# Patient Record
Sex: Female | Born: 1950 | Race: White | Hispanic: No | Marital: Married | Smoking: Never smoker
Health system: Southern US, Community
[De-identification: ages and names within clinical notes are randomized; demographics above are authoritative.]

## PROBLEM LIST (undated history)

## (undated) DIAGNOSIS — K5790 Diverticulosis of intestine, part unspecified, without perforation or abscess without bleeding: Secondary | ICD-10-CM

## (undated) DIAGNOSIS — F32A Depression, unspecified: Secondary | ICD-10-CM

## (undated) DIAGNOSIS — M1990 Unspecified osteoarthritis, unspecified site: Secondary | ICD-10-CM

## (undated) DIAGNOSIS — Z8719 Personal history of other diseases of the digestive system: Secondary | ICD-10-CM

## (undated) DIAGNOSIS — Z8639 Personal history of other endocrine, nutritional and metabolic disease: Secondary | ICD-10-CM

## (undated) DIAGNOSIS — Z86010 Personal history of colonic polyps: Secondary | ICD-10-CM

## (undated) DIAGNOSIS — N390 Urinary tract infection, site not specified: Secondary | ICD-10-CM

## (undated) DIAGNOSIS — K219 Gastro-esophageal reflux disease without esophagitis: Secondary | ICD-10-CM

## (undated) DIAGNOSIS — M810 Age-related osteoporosis without current pathological fracture: Secondary | ICD-10-CM

## (undated) DIAGNOSIS — E7800 Pure hypercholesterolemia, unspecified: Secondary | ICD-10-CM

## (undated) DIAGNOSIS — Z860101 Personal history of adenomatous and serrated colon polyps: Secondary | ICD-10-CM

## (undated) DIAGNOSIS — K449 Diaphragmatic hernia without obstruction or gangrene: Secondary | ICD-10-CM

## (undated) DIAGNOSIS — F329 Major depressive disorder, single episode, unspecified: Secondary | ICD-10-CM

## (undated) HISTORY — DX: Diaphragmatic hernia without obstruction or gangrene: K44.9

## (undated) HISTORY — DX: Age-related osteoporosis without current pathological fracture: M81.0

## (undated) HISTORY — DX: Unspecified osteoarthritis, unspecified site: M19.90

## (undated) HISTORY — DX: Diverticulosis of intestine, part unspecified, without perforation or abscess without bleeding: K57.90

## (undated) HISTORY — DX: Personal history of other endocrine, nutritional and metabolic disease: Z86.39

## (undated) HISTORY — DX: Personal history of adenomatous and serrated colon polyps: Z86.0101

## (undated) HISTORY — DX: Personal history of colonic polyps: Z86.010

## (undated) HISTORY — PX: COLONOSCOPY W/ POLYPECTOMY: SHX1380

---
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
## 1989-08-04 HISTORY — PX: VAGINAL HYSTERECTOMY: SHX2639

## 1999-06-03 ENCOUNTER — Other Ambulatory Visit: Admission: RE | Admit: 1999-06-03 | Discharge: 1999-06-03 | Payer: Self-pay | Admitting: Obstetrics and Gynecology

## 1999-11-27 ENCOUNTER — Encounter (INDEPENDENT_AMBULATORY_CARE_PROVIDER_SITE_OTHER): Payer: Self-pay | Admitting: Specialist

## 1999-11-27 ENCOUNTER — Ambulatory Visit (HOSPITAL_COMMUNITY): Admission: RE | Admit: 1999-11-27 | Discharge: 1999-11-27 | Payer: Self-pay | Admitting: Gastroenterology

## 2000-09-28 ENCOUNTER — Encounter: Payer: Self-pay | Admitting: Obstetrics and Gynecology

## 2000-09-28 ENCOUNTER — Encounter: Admission: RE | Admit: 2000-09-28 | Discharge: 2000-09-28 | Payer: Self-pay | Admitting: Obstetrics and Gynecology

## 2001-06-15 ENCOUNTER — Other Ambulatory Visit: Admission: RE | Admit: 2001-06-15 | Discharge: 2001-06-15 | Payer: Self-pay | Admitting: Obstetrics and Gynecology

## 2002-06-09 ENCOUNTER — Other Ambulatory Visit: Admission: RE | Admit: 2002-06-09 | Discharge: 2002-06-09 | Payer: Self-pay | Admitting: Obstetrics and Gynecology

## 2002-08-18 ENCOUNTER — Encounter: Admission: RE | Admit: 2002-08-18 | Discharge: 2002-11-16 | Payer: Self-pay | Admitting: Obstetrics and Gynecology

## 2003-06-22 ENCOUNTER — Other Ambulatory Visit: Admission: RE | Admit: 2003-06-22 | Discharge: 2003-06-22 | Payer: Self-pay | Admitting: Obstetrics and Gynecology

## 2003-09-22 ENCOUNTER — Ambulatory Visit (HOSPITAL_COMMUNITY): Admission: RE | Admit: 2003-09-22 | Discharge: 2003-09-22 | Payer: Self-pay | Admitting: Gastroenterology

## 2005-03-28 ENCOUNTER — Ambulatory Visit: Payer: Self-pay | Admitting: Internal Medicine

## 2006-07-08 ENCOUNTER — Ambulatory Visit: Payer: Self-pay | Admitting: Internal Medicine

## 2006-08-04 HISTORY — PX: TOTAL HIP ARTHROPLASTY: SHX124

## 2007-03-18 ENCOUNTER — Encounter: Admission: RE | Admit: 2007-03-18 | Discharge: 2007-03-18 | Payer: Self-pay | Admitting: Orthopedic Surgery

## 2007-05-11 ENCOUNTER — Encounter: Admission: RE | Admit: 2007-05-11 | Discharge: 2007-05-11 | Payer: Self-pay | Admitting: Orthopedic Surgery

## 2007-06-11 ENCOUNTER — Encounter: Payer: Self-pay | Admitting: Internal Medicine

## 2007-07-11 ENCOUNTER — Encounter: Payer: Self-pay | Admitting: Internal Medicine

## 2007-07-14 ENCOUNTER — Encounter: Payer: Self-pay | Admitting: Internal Medicine

## 2007-08-11 ENCOUNTER — Encounter: Payer: Self-pay | Admitting: Internal Medicine

## 2007-08-26 ENCOUNTER — Encounter: Payer: Self-pay | Admitting: Internal Medicine

## 2007-08-30 ENCOUNTER — Inpatient Hospital Stay (HOSPITAL_COMMUNITY): Admission: RE | Admit: 2007-08-30 | Discharge: 2007-09-01 | Payer: Self-pay | Admitting: Orthopedic Surgery

## 2007-12-03 ENCOUNTER — Ambulatory Visit: Payer: Self-pay | Admitting: Internal Medicine

## 2007-12-03 DIAGNOSIS — R609 Edema, unspecified: Secondary | ICD-10-CM | POA: Insufficient documentation

## 2007-12-03 DIAGNOSIS — M2550 Pain in unspecified joint: Secondary | ICD-10-CM | POA: Insufficient documentation

## 2007-12-03 DIAGNOSIS — IMO0001 Reserved for inherently not codable concepts without codable children: Secondary | ICD-10-CM

## 2007-12-03 LAB — CONVERTED CEMR LAB
Basophils Absolute: 0 10*3/uL (ref 0.0–0.1)
Basophils Relative: 0.4 % (ref 0.0–1.0)
Calcium: 9.4 mg/dL (ref 8.4–10.5)
Eosinophils Absolute: 0.2 10*3/uL (ref 0.0–0.7)
Eosinophils Relative: 4.3 % (ref 0.0–5.0)
Free T4: 0.8 ng/dL (ref 0.6–1.6)
HCT: 38 % (ref 36.0–46.0)
Hemoglobin: 12.5 g/dL (ref 12.0–15.0)
Lymphocytes Relative: 39 % (ref 12.0–46.0)
MCHC: 33 g/dL (ref 30.0–36.0)
MCV: 80.6 fL (ref 78.0–100.0)
Magnesium: 2.2 mg/dL (ref 1.5–2.5)
Monocytes Absolute: 0.4 10*3/uL (ref 0.1–1.0)
Monocytes Relative: 6.5 % (ref 3.0–12.0)
Neutro Abs: 2.8 10*3/uL (ref 1.4–7.7)
Neutrophils Relative %: 49.8 % (ref 43.0–77.0)
Platelets: 200 10*3/uL (ref 150–400)
Potassium: 3.5 meq/L (ref 3.5–5.1)
RBC: 4.72 M/uL (ref 3.87–5.11)
RDW: 13.9 % (ref 11.5–14.6)
Rheumatoid fact SerPl-aCnc: <20 intl units/mL — ABNORMAL LOW (ref 0.0–20.0)
Sed Rate: 13 mm/hr (ref 0–22)
TSH: 1.14 microintl units/mL (ref 0.35–5.50)
Total CK: 42 units/L (ref 7–177)
Uric Acid, Serum: 5.9 mg/dL (ref 2.4–7.0)
Vit D, 1,25-Dihydroxy: 32 (ref 30–89)
WBC: 5.6 10*3/uL (ref 4.5–10.5)

## 2007-12-07 ENCOUNTER — Telehealth (INDEPENDENT_AMBULATORY_CARE_PROVIDER_SITE_OTHER): Payer: Self-pay | Admitting: *Deleted

## 2007-12-07 ENCOUNTER — Encounter: Payer: Self-pay | Admitting: Internal Medicine

## 2007-12-09 ENCOUNTER — Ambulatory Visit: Payer: Self-pay | Admitting: Internal Medicine

## 2007-12-09 DIAGNOSIS — E559 Vitamin D deficiency, unspecified: Secondary | ICD-10-CM | POA: Insufficient documentation

## 2007-12-09 DIAGNOSIS — F329 Major depressive disorder, single episode, unspecified: Secondary | ICD-10-CM

## 2007-12-10 ENCOUNTER — Encounter: Payer: Self-pay | Admitting: Internal Medicine

## 2007-12-14 ENCOUNTER — Encounter (INDEPENDENT_AMBULATORY_CARE_PROVIDER_SITE_OTHER): Payer: Self-pay | Admitting: *Deleted

## 2008-05-30 ENCOUNTER — Telehealth (INDEPENDENT_AMBULATORY_CARE_PROVIDER_SITE_OTHER): Payer: Self-pay | Admitting: *Deleted

## 2008-06-05 ENCOUNTER — Telehealth (INDEPENDENT_AMBULATORY_CARE_PROVIDER_SITE_OTHER): Payer: Self-pay | Admitting: *Deleted

## 2008-09-29 ENCOUNTER — Telehealth (INDEPENDENT_AMBULATORY_CARE_PROVIDER_SITE_OTHER): Payer: Self-pay | Admitting: *Deleted

## 2008-10-06 ENCOUNTER — Telehealth (INDEPENDENT_AMBULATORY_CARE_PROVIDER_SITE_OTHER): Payer: Self-pay | Admitting: *Deleted

## 2009-01-03 ENCOUNTER — Telehealth (INDEPENDENT_AMBULATORY_CARE_PROVIDER_SITE_OTHER): Payer: Self-pay | Admitting: *Deleted

## 2009-02-27 ENCOUNTER — Telehealth (INDEPENDENT_AMBULATORY_CARE_PROVIDER_SITE_OTHER): Payer: Self-pay | Admitting: *Deleted

## 2009-04-04 ENCOUNTER — Telehealth (INDEPENDENT_AMBULATORY_CARE_PROVIDER_SITE_OTHER): Payer: Self-pay | Admitting: *Deleted

## 2009-06-26 ENCOUNTER — Ambulatory Visit: Payer: Self-pay | Admitting: Internal Medicine

## 2010-12-17 NOTE — Op Note (Signed)
NAME:  Katelyn Cuevas, SKEEN          ACCOUNT NO.:  [id number]   MEDICAL RECORD NO.:  [id number]          PATIENT TYPE:  INP   LOCATION:  2550                         FACILITY:  [facility]   PHYSICIAN:  Elana Alm. Thurston Hole, M.D. DATE OF BIRTH:  07/13/1951   DATE OF PROCEDURE:  08/30/2007  DATE OF DISCHARGE:                               OPERATIVE REPORT   PREOPERATIVE DIAGNOSIS:  Left hip degenerative joint disease.   POSTOPERATIVE DIAGNOSIS:  Left hip degenerative joint disease.   PROCEDURE:  Left total hip replacement using DePuy S-ROM Press-Fit total  hip system with acetabulum, 52 mm Press-Fit ASR acetabular cup with  femoral component 18 x 13 femoral stem with 36 standard femoral neck  with +3 x 46 mm ASR head with 18D large ZTT sleeve.   SURGEON:  Salvatore Marvel, MD.   ASSISTANT:  Julien Girt, PA.   ANESTHESIA:  General.   OPERATIVE TIME:  1 hour and 15 minutes.   ESTIMATED BLOOD LOSS:  150 mL.   COMPLICATIONS:  None.   DESCRIPTION OF PROCEDURE:  [name] was brought in the operating  room on August 30, 2007, placed on the operative table in the supine  position.  After an adequate level of general anesthesia was obtained  she had a Foley catheter placed under sterile conditions.  She received  Ancef 1 grams IV preoperatively for prophylaxis.  Her left hip was  examined.  She had flexion to 90, extension to 0, internal and external  rotation of 20 degrees with approximately 1/2-inch of shortening of the  left leg compared to the right.  She was then turned in the left lateral  up decubitus position and secured on the bed with a Mark frame.  Her  left hip and leg was prepped using sterile DuraPrep and draped using  sterile technique.  Originally, through a 10 and 12 cm posterolateral  greater trochanteric incision initial exposure was made.  The  subcutaneous tissues were incised in line with skin incision.  The  iliotibial band and gluteus maximus fascia was incised  longitudinally,  revealing the underlying sciatic nerve which was carefully protected.  The short external rotators of the hip and hip capsule were released off  the femoral neck insertion and tagged.  The hip was then posteriorly  dislocated.  The femoral head was found to have significant grade 3 and  4 DJD.  A femoral neck cut was then made 1.5 to 2 cm above the lesser  trochanter and the femoral head was removed.  Carefully we placed  retractors, were placed around the acetabulum.  Degenerative acetabular  labrum was removed.  The acetabulum showed grade 3 and 4 DJD as well.  Sequential acetabular reaming was then carried out to a 51 mm size in  the appropriate amount of anteversion, abduction and inclination and  then a 52 mm trial cup was placed with an excellent fit.  It was then  removed and the actual 52 mm ASR cup in the appropriate amount of  anteversion, abduction and inclination was hammered into position with  an excellent fit.  After this was done, then these  retractors were  carefully removed and then the proximal femur was exposed.  Sequential  femoral reaming was carried out to a 13.5 size and proximal reaming to  an 18 and then the calcar reaming was carried out to an 18D large.  The  18D large sleeve trial was placed and then the femoral stem trial was  placed with a +3 x 3 6 mm femoral head trial.  The hip was then reduced,  taken through a range of motion, found to be a full range of motion and  stable up to 80 degrees of internal rotation in both neutral and 30  degrees of abduction and also stable on abduction, external rotation.  After this was done it was found to be an excellent fit and the femoral  trials were removed.  The femoral canal thoroughly irrigated and then  the 18D large sleeve prosthesis was hammered into position with an  excellent fit and then the 18 x 13 femoral stem with a 36 standard neck  was hammered in position and the appropriate amount of  anteversion with  an excellent tight fit, and then the +3 x 46 mm ASR head was hammered on  the femoral neck with an excellent Morse taper fit.  The hip was then  reduced, taken through a full range of motion, found to be stable, leg  lengths equal.  At this point it was felt that all components were of  excellent size, fit and stability.  The wound was further irrigated and  then the short external rotators of the hip and hip capsule were  reattached to their femoral neck insertion through two drill holes in  greater trochanter.  The iliotibial band and gluteus maximus fascia was  closed with a #1 Ethibond suture.  The subcutaneous tissue was closed  with #0 and #2-0 Vicryl, subcuticular layer closed with #4-0 Monocryl,  sterile dressings were applied.  The patient then turned supine, checked  for leg lengths equal, rotation equal, pulses 2+ and symmetric.  Needle  and sponge counts correct x3 at the end of the case.  She was then  awakened, extubated and taken to the recovery room in stable condition.      Robert A. Thurston Hole, M.D.  Electronically Signed     RAW/MEDQ  D:  08/30/2007  T:  08/30/2007  Job:  [id number]

## 2010-12-20 NOTE — Assessment & Plan Note (Signed)
[facility]                                 ON-CALL NOTE   NAME:WILLIAMSNeriah, Brott                   MRN:          [id number]  DATE:10/22/2006                            DOB:          [birth year]    The patient calling because her grandchild has been diagnosed with the  flu.  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  She had her flu shot.  Recommend that she use the usual precautions.  We  then discussed Tamiflu.  I explained to her the potential side effects  of Tamiflu and the fact that it only shortens the duration of the  illness by 12 hours and really was not worthwhile.     Jeffrey A. Tawanna Cooler, MD  Electronically Signed    JAT/MedQ  DD: 10/22/2006  DT: 10/23/2006  Job #: [id number]

## 2010-12-20 NOTE — H&P (Signed)
NAME:  Katelyn Cuevas, Katelyn Cuevas                    ACCOUNT NO.:  [id number]   MEDICAL RECORD NO.:  [id number]                   PATIENT TYPE:  AMB   LOCATION:  ENDO                                 FACILITY:  [facility]   PHYSICIAN:  Bernette Redbird, M.D.                DATE OF BIRTH:  [birth year]   DATE OF ADMISSION:  09/22/2003  DATE OF DISCHARGE:  09/22/2003                                HISTORY & PHYSICAL   REASON FOR ADMISSION:  This 60 year old female is coming into the endoscopy  unit for surveillance colonoscopy, having had small colonic adenomas removed  several years ago.   PAST MEDICAL HISTORY:  No known allergies.   OUTPATIENT MEDICATIONS:  Effexor and estrogen supplement.   OPERATIONS:  Hysterectomy.   CHRONIC MEDICAL ILLNESSES:  No cardiopulmonary disease, diabetes, or  hypertension.   PHYSICAL EXAMINATION:  CHEST AND HEART:  Unremarkable.  GENERAL:  Alert and oriented.   IMPRESSION:  Suitable for IV conscious sedation.   PLAN:  Proceed to colonoscopy.                                                Bernette Redbird, M.D.    RB/MEDQ  D:  10/18/2003  T:  10/19/2003  Job:  [id number]

## 2010-12-20 NOTE — Op Note (Signed)
NAME:  Katelyn Cuevas, Katelyn Cuevas                    ACCOUNT NO.:  [id number]   MEDICAL RECORD NO.:  [id number]                   PATIENT TYPE:  AMB   LOCATION:  ENDO                                 FACILITY:  [facility]   PHYSICIAN:  Bernette Redbird, M.D.                DATE OF BIRTH:  [birth year]   DATE OF PROCEDURE:  09/22/2003  DATE OF DISCHARGE:                                 OPERATIVE REPORT   PROCEDURE:  Colonoscopy.   INDICATIONS:  This is a 60 year old female, now 4 years status post removal  of a couple of small adenomatous polyps.  There is a family history of colon  polyps as well.   FINDINGS:  Normal exam to the cecum.  No evidence of recurrent polyps.   PROCEDURE:  The nature, purpose, and risks of the procedure were familiar to  the patient from prior examination, and she provided written consent.  Sedation was fentanyl 75 mcg and Versed 8 mg IV, without arrhythmias or  desaturations.   The Olympus adjustable-tension pediatric videocolonoscope was advanced to  the using some external abdominal compression to control looping, and  pullback was then performed.  The quality of the prep was excellent, and it  was found that all areas were well seen.   This was a normal exam.  Specifically, there was no evidence of recurrent  polyps and no evidence of cancer, colitis, vascular malformations, or  diverticulosis.  Retroflexion in the rectum and reinspection of the rectum  and lower sigmoid was unremarkable.  No biopsies were obtained.  The patient  tolerated the procedure well, and there were no apparent complications.   IMPRESSION:  1. History of prior colonic adenomas, with currently negative exam (V12.72).  2. Family history of colon polyps (V19.8).   PLAN:  Follow-up colonoscopy in 5 years.                                               Bernette Redbird, M.D.    RB/MEDQ  D:  09/22/2003  T:  09/23/2003  Job:  [id number]   cc:   Juluis Mire, M.D.  [address] [place]  [place] [address]  Fax: [phone]   Titus Dubin. Alwyn Ren, M.D. [facility]

## 2010-12-20 NOTE — Procedures (Signed)
[facility]  Patient:    Katelyn Cuevas, Katelyn Cuevas                 MRN: [id number] Proc. Date: 11/27/99 Adm. Date:  [id number] Attending:  Rich Brave CC:         Ronnald Nian, M.D.                           Procedure Report  PROCEDURE:  Colonoscopy with polypectomy and hot biopsy.  INDICATION:  A 60 year old female with family history of colon polyps, for screening.  FINDINGS:  Several small to medium sized polyps removed.  INFORMED CONSENT:  The nature, purpose, and risks of the procedure had been discussed with the patient who provided written consent.  PREMEDICATION:  Fentanyl 60 mcg and Versed 6 mg IV without arrhythmias or desaturations.  DESCRIPTION OF PROCEDURE:  The Olympus pediatric video adjustable tension colonoscope was easily advanced to the cecum as identified by visualization of he appendiceal orifice.  Pullback was then performed.  The quality of prep was excellent.  It was felt that all areas were well seen.  In the cecum was a small 2 mm sessile hyperplastic appearing polyp removed by single cold biopsy as well as a somewhat larger slightly verrucous pale polypoid measuring about 2 by 3 mm, removed by several hot biopsies with essentially complete ablation of the lesion.  At 35 cm, there was a semipedunculated 7 mm polyp removed in two pieces by snare technique with complete hemostasis and no evidence of excessive cautery and nearby a 3 mm sessile polyp removed by hot biopsy.  No large polyps, cancer, vascular malformations, or diverticular disease were observed.  Retroflexion in the rectum was unremarkable.  The patient tolerated the procedure well and there were no apparent complications.  IMPRESSION:  Colon polyps removed as described above.  Otherwise normal exam.  PLAN:  Follow-up colonoscopy in three to five years depending on the histologic  findings on the polyps. DD:  11/27/99 TD:   11/27/99 Job: [id number]

## 2011-04-24 LAB — BASIC METABOLIC PANEL
BUN: 7
CO2: 31
Calcium: 8.2 — ABNORMAL LOW
Calcium: 8.5
Creatinine, Ser: 0.63
GFR calc Af Amer: >60
GFR calc non Af Amer: >60
GFR calc non Af Amer: >60
Potassium: 3.8
Sodium: 140

## 2011-04-24 LAB — TYPE AND SCREEN: Antibody Screen: NEGATIVE

## 2011-04-24 LAB — PROTIME-INR
INR: 0.9
INR: 1.1
INR: 1.7 — ABNORMAL HIGH
Prothrombin Time: 12.8
Prothrombin Time: 14.3
Prothrombin Time: 20.5 — ABNORMAL HIGH

## 2011-04-24 LAB — DIFFERENTIAL
Basophils Absolute: 0
Basophils Relative: 1
Eosinophils Absolute: 0.1
Eosinophils Relative: 1
Lymphs Abs: 3.1
Neutrophils Relative %: 54

## 2011-04-24 LAB — CBC
HCT: 30.1 — ABNORMAL LOW
Hemoglobin: 13.4
Hemoglobin: 8.8 — ABNORMAL LOW
MCHC: 34
Platelets: 200
RBC: 3.21 — ABNORMAL LOW
RBC: 4.86
WBC: 11.1 — ABNORMAL HIGH
WBC: 7
WBC: 7.8

## 2011-04-24 LAB — URINALYSIS, ROUTINE W REFLEX MICROSCOPIC
Hgb urine dipstick: NEGATIVE
Protein, ur: NEGATIVE
Urobilinogen, UA: 0.2

## 2011-04-24 LAB — COMPREHENSIVE METABOLIC PANEL
ALT: 27
AST: 26
Alkaline Phosphatase: 120 — ABNORMAL HIGH
CO2: 29
Calcium: 10.2
Chloride: 104
GFR calc Af Amer: >60
GFR calc non Af Amer: >60
Glucose, Bld: 95
Potassium: 3.8
Sodium: 140
Total Bilirubin: 0.7

## 2011-04-24 LAB — URINE CULTURE

## 2011-04-24 LAB — ABO/RH: ABO/RH(D): O NEG

## 2012-09-29 DIAGNOSIS — Z96649 Presence of unspecified artificial hip joint: Secondary | ICD-10-CM | POA: Insufficient documentation

## 2013-04-15 ENCOUNTER — Other Ambulatory Visit: Payer: Self-pay | Admitting: Gastroenterology

## 2013-05-02 ENCOUNTER — Encounter: Payer: Self-pay | Admitting: Internal Medicine

## 2013-05-02 DIAGNOSIS — D126 Benign neoplasm of colon, unspecified: Secondary | ICD-10-CM | POA: Insufficient documentation

## 2013-10-19 ENCOUNTER — Encounter: Payer: Self-pay | Admitting: Family Medicine

## 2013-10-19 ENCOUNTER — Ambulatory Visit (INDEPENDENT_AMBULATORY_CARE_PROVIDER_SITE_OTHER): Payer: 59 | Admitting: Family Medicine

## 2013-10-19 VITALS — BP 132/81 | HR 74 | Temp 98.3°F | Resp 18 | Ht 66.0 in | Wt 163.0 lb

## 2013-10-19 DIAGNOSIS — N390 Urinary tract infection, site not specified: Secondary | ICD-10-CM

## 2013-10-19 DIAGNOSIS — R300 Dysuria: Secondary | ICD-10-CM

## 2013-10-19 LAB — POCT URINALYSIS DIPSTICK
Bilirubin, UA: NEGATIVE
Glucose, UA: NEGATIVE
KETONES UA: NEGATIVE
Nitrite, UA: NEGATIVE
PH UA: 7
PROTEIN UA: NEGATIVE
SPEC GRAV UA: 1.015
Urobilinogen, UA: 0.2

## 2013-10-19 MED ORDER — SULFAMETHOXAZOLE-TMP DS 800-160 MG PO TABS: 1.0000 | ORAL_TABLET | Freq: Two times a day (BID) | ORAL | Status: DC

## 2013-10-19 NOTE — Progress Notes (Signed)
OFFICE NOTE  10/19/2013  CC:  Chief Complaint  Patient presents with  . Dysuria    x 1 week    HPI: Patient is a 63 y.o. Caucasian female who is here for dysuria.  Onset about a week ago, with urinary urgency and frequency.  No fever, no abd pain, no nausea.  NO back/flank pain. Azo taken but not in about 2 days.  Has plans for right partial knee replacement at [facility] 11/09/13.  Pertinent PMH:  Osteoarthritis mutiple joints Left Total hip arth 2008 Only 1-2 UTIs in the past  MEDS:  Wellbutrin XL 1 tab daily (pt doesn't recall dose) Tylenol arthritis  PE: Blood pressure 132/81, pulse 74, temperature 98.3 F (36.8 C), temperature source Temporal, resp. rate 18, height 5\' 6"  (1.676 m), weight 163 lb (73.936 kg), SpO2 98.00%. Gen: Alert, well appearing.  Patient is oriented to person, place, time, and situation. CV: RRR, no m/r/g.   LUNGS: CTA bilat, nonlabored resps, good aeration in all lung fields.  LAB: CC UA today showed moderate blood, large LEU, otherwise normal.  IMPRESSION AND PLAN:  1) Simple UTI. Sent urine for c/s.  Start bactrim DS 1 bid x 5d. Therapeutic expectations and side effect profile of medication discussed today.  Patient's questions answered.  An After Visit Summary was printed and given to the patient.  FOLLOW UP: prn

## 2013-10-19 NOTE — Progress Notes (Signed)
Pre visit review using our clinic review tool, if applicable. No additional management support is needed unless otherwise documented below in the visit note. 

## 2013-10-21 LAB — URINE CULTURE

## 2013-11-02 HISTORY — PX: TOTAL KNEE ARTHROPLASTY: SHX125

## 2014-02-02 DIAGNOSIS — Z96651 Presence of right artificial knee joint: Secondary | ICD-10-CM

## 2014-05-19 ENCOUNTER — Other Ambulatory Visit: Payer: Self-pay

## 2014-06-06 ENCOUNTER — Other Ambulatory Visit: Payer: Self-pay | Admitting: Gastroenterology

## 2014-06-06 DIAGNOSIS — R1012 Left upper quadrant pain: Secondary | ICD-10-CM

## 2014-06-13 ENCOUNTER — Encounter: Payer: Self-pay | Admitting: Family Medicine

## 2014-06-13 ENCOUNTER — Ambulatory Visit (INDEPENDENT_AMBULATORY_CARE_PROVIDER_SITE_OTHER): Payer: Medicare Other | Admitting: Family Medicine

## 2014-06-13 VITALS — BP 122/85 | HR 69 | Temp 98.0°F | Ht 66.0 in | Wt 160.0 lb

## 2014-06-13 DIAGNOSIS — Z23 Encounter for immunization: Secondary | ICD-10-CM

## 2014-06-13 DIAGNOSIS — Z0000 Encounter for general adult medical examination without abnormal findings: Secondary | ICD-10-CM | POA: Diagnosis not present

## 2014-06-13 NOTE — Progress Notes (Signed)
WELCOME TO MEDICARE (IPPE) VISIT I explained that today's visit was for the purpose of health promotion and disease detection, as well as an introduction to Medicare and it's covered benefits.  I explained that no labs or other services would be performed today, but if any were determined to be necessary then appropriate orders/referrals would be arranged for these to be done at a future date.  Patient is a 63 y/o white female who is establishing care with me, transfer from Dr. Linna Darner who is retiring.  Pt's medical and social history were reviewed. Specifically, we reviewed PMH/PSH/Meds/FH.  Also reviewed alcohol, tobacco, and illicit drug use.   Diet and physical activity reviewed.  Active taking care of grandchildren but no regular aerobic exercise. Diet is heart healthy: currently having some digestion problems and is getting further evaluation with Dr. Nash Mantis in GI at this time (barium swallow planned for later this week).  All of this info is also found in the appropriate sections of pt's EMR.  Pt was screened with appropriate screening instrument for depression.  Currently on wellbutrin for depression since the death of her son in 14-Nov-2001: feels like this is helping/stable.  Pt's functional ability and level of safety were reviewed. Specifically, I screened for hearing impairment and fall risk.  I assessed home safety and we discussed pt's competency with activities of daily living.   No fall risk.  Fully functional pt.  EXAM: Filed Vitals:   06/13/14 0932  BP: 122/85  Pulse: 69  Temp: 98 F (36.7 C)   Body mass index is 25.84 kg/(m^2). Visual acuity screen: No exam data present  Pt gets routine eye care through optometrist/ophthalmologist and is  up to date with follow up care with [facility]. No additional physical exam required or indicated today.  End of life planning: Advanced directives and power of attorney information specific to the patient were discussed.  Pt has this  info in writing.    Education, counseling, and referrals based on the information obtained/reviewed today: See below  Education, counseling, and referral for other preventive services: Written checklist was completed and given to pt for obtaining, as appropriate, the other preventive services that are covered as separate Medicare Part B benefits. Possible services that were reviewed with pt are:  -annual wellness visit (AWV) -Bone mass measurements -Cardiovascular screening blood tests -Colorectal cancer screening -Counseling to prevent tobacco use -Diabetes screening tests -Diabetes self-management training (DSMT) -Glaucoma screening -HIV screening -Medical nutrition therapy -Prostate cancer screening -Seasonal influenza, pneumococcal, and Hep B vaccines -screening mammography -screening pap tests and pelvic exam -ultrasound screening for AAA  Of the above listed services, the patient will be getting CV blood screening tests, GYN exam and mammography (and bone density as approp) through her GYN provider (Dr. Radene Knee).  Pt will get Tdap and prevnar 13 today.  She is UTD regarding colon cancer screening.  These are the only medicare services applicable at this time.  Patient did not have an additional complaint/problem that was discussed and evaluated today, although she informed me she has been having left upper quadrant abd pain randomly for about the last month, sometimes severe, went to ED in [place] on one occasion then went to Dr. Cristina Gong, her GI MD and is getting this further evaluated through him at this time.  Patient was given opportunity to ask any additional questions regarding Medicare and covered benefits.  Patient was informed that Medicare does not provide coverage for routine physical exams.  I answered all questions  to the best of my ability today.    Follow up: 1 yr for annual medicare wellness visit

## 2014-06-13 NOTE — Progress Notes (Signed)
Pre visit review using our clinic review tool, if applicable. No additional management support is needed unless otherwise documented below in the visit note. 

## 2014-06-15 ENCOUNTER — Ambulatory Visit
Admission: RE | Admit: 2014-06-15 | Discharge: 2014-06-15 | Disposition: A | Discharge status: Home / Self Care | Payer: 59 | Source: Ambulatory Visit | Attending: Gastroenterology | Admitting: Gastroenterology

## 2014-06-15 DIAGNOSIS — R1012 Left upper quadrant pain: Secondary | ICD-10-CM

## 2014-06-21 ENCOUNTER — Other Ambulatory Visit: Payer: Self-pay | Admitting: Gastroenterology

## 2014-06-21 DIAGNOSIS — R1013 Epigastric pain: Secondary | ICD-10-CM

## 2014-06-22 ENCOUNTER — Encounter: Payer: Self-pay | Admitting: Family Medicine

## 2014-06-26 ENCOUNTER — Ambulatory Visit
Admission: RE | Admit: 2014-06-26 | Discharge: 2014-06-26 | Disposition: A | Discharge status: Home / Self Care | Payer: Medicare Other | Source: Ambulatory Visit | Attending: Gastroenterology | Admitting: Gastroenterology

## 2014-06-26 DIAGNOSIS — R1013 Epigastric pain: Secondary | ICD-10-CM

## 2014-06-27 ENCOUNTER — Ambulatory Visit (INDEPENDENT_AMBULATORY_CARE_PROVIDER_SITE_OTHER): Payer: Self-pay | Admitting: Surgery

## 2014-06-27 NOTE — H&P (Signed)
Katelyn Cuevas 06/27/2014 2:24 PM Location: [facility] Patient #: [id number] DOB: 24-May-1951 Married / Language: Cleophus Molt / Race: White Female  History of Present Illness Rodman Key B. Hassell Done MD; 06/27/2014 3:41 PM) Patient words: Large type III mixed hiatus hernia Has had a lot of abdominal pain and dypshagia to certain solids. She has had some episodes of pain which Dr. Si Gaul and he was concerned might be ischemia of her pouch or it could be where something is getting enlarged in her esophagus. I showed her the upper GI series and held this large hiatal hernia is in her chest and impinging on the distal esophagus. It almost appears more like a type II hiatal hernia and may be acting like that. As long as she can control her symptoms she would like to get through the holidays and do this perhaps in January. I told her to do this at [facility]. I described the operation in some detail. They are aware of the indications and risks.   She has not had any abdominal surgery. She denies DVT.  The patient is a 63 year old female    Other Problems Marjean Donna, [place]; 06/27/2014 2:24 PM) Arthritis Gastroesophageal Reflux Disease  Past Surgical History Marjean Donna, [place]; 06/27/2014 2:24 PM) Cesarean Section - 1 Hip Surgery Left. Hysterectomy (due to cancer) - Partial Knee Surgery Right. Oral Surgery  Diagnostic Studies History ([place]; 06/27/2014 2:24 PM) Colonoscopy 1-5 years ago Mammogram 1-3 years ago Pap Smear 1-5 years ago  Allergies Marjean Donna, [place]; 06/27/2014 2:25 PM) No Known Drug Allergies11/24/2015  Medication History (Sonya Bynum, CMA; 06/27/2014 2:26 PM) BuPROPion HCl ER (XL) (300MG  Tablet ER 24HR, Oral) Active.  Social History ([place]; 06/27/2014 2:24 PM) Caffeine use Carbonated beverages, Coffee, Tea. No alcohol use No drug use Tobacco use Never smoker.  Family History Marjean Donna, [place]; 06/27/2014 2:24  PM) Anesthetic complications Mother. Arthritis Father, Mother. Colon Polyps Father. Prostate Cancer Father.  Pregnancy / Birth History Marjean Donna, [place]; 06/27/2014 2:24 PM) Age of menopause 59-60 Contraceptive History Intrauterine device, Oral contraceptives. Gravida 2 Maternal age 64-25 Para 2  Review of Systems ([place]; 06/27/2014 2:24 PM) General Not Present- Appetite Loss, Chills, Fatigue, Fever, Night Sweats, Weight Gain and Weight Loss. Skin Not Present- Change in Wart/Mole, Dryness, Hives, Jaundice, New Lesions, Non-Healing Wounds, Rash and Ulcer. HEENT Present- Wears glasses/contact lenses. Not Present- Earache, Hearing Loss, Hoarseness, Nose Bleed, Oral Ulcers, Ringing in the Ears, Seasonal Allergies, Sinus Pain, Sore Throat, Visual Disturbances and Yellow Eyes. Respiratory Not Present- Bloody sputum, Chronic Cough, Difficulty Breathing, Snoring and Wheezing. Breast Not Present- Breast Mass, Breast Pain, Nipple Discharge and Skin Changes. Gastrointestinal Present- Abdominal Pain and Indigestion. Not Present- Bloating, Bloody Stool, Change in Bowel Habits, Chronic diarrhea, Constipation, Difficulty Swallowing, Excessive gas, Gets full quickly at meals, Hemorrhoids, Nausea, Rectal Pain and Vomiting. Female Genitourinary Present- Urgency. Not Present- Frequency, Nocturia, Painful Urination and Pelvic Pain. Musculoskeletal Present- Joint Pain. Not Present- Back Pain, Joint Stiffness, Muscle Pain, Muscle Weakness and Swelling of Extremities. Neurological Not Present- Decreased Memory, Fainting, Headaches, Numbness, Seizures, Tingling, Tremor, Trouble walking and Weakness. Psychiatric Not Present- Anxiety, Bipolar, Change in Sleep Pattern, Depression, Fearful and Frequent crying. Endocrine Not Present- Cold Intolerance, Excessive Hunger, Hair Changes, Heat Intolerance, Hot flashes and New Diabetes.   Vitals (Sonya Bynum CMA; 06/27/2014 2:25 PM) 06/27/2014 2:25  PM Weight: 161 lb Height: 66in Body Surface Area: 1.84 m Body Mass Index: 25.99 kg/m Temp.: 23F(Temporal)  Pulse:  78 (Regular)  BP: 124/70 (Sitting, Left Arm, Standard)    Physical Exam (Alejo Beamer B. Hassell Done MD; 06/27/2014 3:42 PM) The physical exam findings are as follows: Note:HEENT-negative Neck-supple Chest-clear Heart-SR without murmurs Abdomen-nontender Ext-prior right knee and left hip replacements    Assessment & Plan Rodman Key B. Hassell Done MD; 06/27/2014 3:43 PM) HIATUS HERNIA SYNDROME (553.3  K44.9)

## 2014-07-03 ENCOUNTER — Other Ambulatory Visit: Payer: 59

## 2014-07-20 ENCOUNTER — Telehealth: Payer: Self-pay

## 2014-07-20 MED ORDER — SULFAMETHOXAZOLE-TRIMETHOPRIM 800-160 MG PO TABS: 1.0000 | ORAL_TABLET | Freq: Two times a day (BID) | ORAL | Status: DC

## 2014-07-20 NOTE — Telephone Encounter (Signed)
Pt called requesting a Rx for a urinary tract infection. I advised she may have to come in for OV but she wanted to see if Dr. Anitra Lauth could just call in Rx since she was just here and have the same symptoms. Her symptoms include urinary frequency,burning, and pain. Please advise.

## 2014-07-20 NOTE — Telephone Encounter (Signed)
LM for pt to CB.  Sent Rx to [facility] in [place].

## 2014-07-20 NOTE — Telephone Encounter (Signed)
OK to send in bactrim DS, 1 tab po bid x 3d, #6, no RF.-thx

## 2014-09-03 ENCOUNTER — Emergency Department (HOSPITAL_BASED_OUTPATIENT_CLINIC_OR_DEPARTMENT_OTHER)
Admission: EM | Admit: 2014-09-03 | Discharge: 2014-09-03 | Disposition: A | Discharge status: Home / Self Care | Payer: Medicare Other | Attending: Emergency Medicine | Admitting: Emergency Medicine

## 2014-09-03 ENCOUNTER — Encounter (HOSPITAL_BASED_OUTPATIENT_CLINIC_OR_DEPARTMENT_OTHER): Payer: Self-pay

## 2014-09-03 DIAGNOSIS — Z8659 Personal history of other mental and behavioral disorders: Secondary | ICD-10-CM | POA: Diagnosis not present

## 2014-09-03 DIAGNOSIS — Z8739 Personal history of other diseases of the musculoskeletal system and connective tissue: Secondary | ICD-10-CM | POA: Insufficient documentation

## 2014-09-03 DIAGNOSIS — Z8719 Personal history of other diseases of the digestive system: Secondary | ICD-10-CM | POA: Insufficient documentation

## 2014-09-03 DIAGNOSIS — N3000 Acute cystitis without hematuria: Secondary | ICD-10-CM | POA: Diagnosis not present

## 2014-09-03 DIAGNOSIS — Z79899 Other long term (current) drug therapy: Secondary | ICD-10-CM | POA: Diagnosis not present

## 2014-09-03 DIAGNOSIS — Z86010 Personal history of colonic polyps: Secondary | ICD-10-CM | POA: Diagnosis not present

## 2014-09-03 DIAGNOSIS — Z90710 Acquired absence of both cervix and uterus: Secondary | ICD-10-CM | POA: Diagnosis not present

## 2014-09-03 DIAGNOSIS — R300 Dysuria: Secondary | ICD-10-CM | POA: Diagnosis present

## 2014-09-03 DIAGNOSIS — Z792 Long term (current) use of antibiotics: Secondary | ICD-10-CM | POA: Insufficient documentation

## 2014-09-03 LAB — URINALYSIS, ROUTINE W REFLEX MICROSCOPIC
Bilirubin Urine: NEGATIVE
GLUCOSE, UA: NEGATIVE mg/dL
Ketones, ur: NEGATIVE mg/dL
NITRITE: POSITIVE — AB
PROTEIN: 30 mg/dL — AB
Specific Gravity, Urine: 1.01 (ref 1.005–1.030)
UROBILINOGEN UA: 2 mg/dL — AB (ref 0.0–1.0)
pH: 8 (ref 5.0–8.0)

## 2014-09-03 LAB — URINE MICROSCOPIC-ADD ON

## 2014-09-03 MED ORDER — PHENAZOPYRIDINE HCL 200 MG PO TABS: 200.0000 mg | ORAL_TABLET | Freq: Three times a day (TID) | ORAL | Status: DC

## 2014-09-03 MED ORDER — SULFAMETHOXAZOLE-TRIMETHOPRIM 800-160 MG PO TABS: 1.0000 | ORAL_TABLET | Freq: Two times a day (BID) | ORAL | Status: DC

## 2014-09-03 NOTE — ED Notes (Signed)
Patient here with dysuria, back pain and frequency that started last pm, took AZO with minimal relief. Had keflex and took 2 last pm, took 1000mg 

## 2014-09-03 NOTE — ED Notes (Signed)
MD at bedside. 

## 2014-09-03 NOTE — Discharge Instructions (Signed)

## 2014-09-09 NOTE — ED Provider Notes (Signed)
CSN: [id number]     Arrival date & time 09/03/14  0846 History   First MD Initiated Contact with Patient 09/03/14 [phone]     Chief Complaint  Patient presents with  . Dysuria     (Consider location/radiation/quality/duration/timing/severity/associated sxs/prior Treatment) Patient is a 64 y.o. female presenting with dysuria. The history is provided by the patient.  Dysuria Pain quality:  Aching Pain severity:  Moderate Onset quality:  Gradual Duration:  1 day Timing:  Constant Progression:  Unchanged Chronicity:  Recurrent Recent urinary tract infections: yes   Relieved by:  Nothing Worsened by:  Nothing tried Associated symptoms: no fever     Past Medical History  Diagnosis Date  . Osteoarthritis     Multiple joints; left THR   . Anxiety and depression   . History of vitamin D deficiency   . History of adenomatous polyp of colon   . Paraesophageal hiatal hernia    Past Surgical History  Procedure Laterality Date  . Total hip arthroplasty  2008    left (Dr. Noemi Chapel)  . Vaginal hysterectomy  1991    fibroids (both ovaries still in)  . Colonoscopy w/ polypectomy  Multiple.  Most recent 04/15/13.     2 adenomas; repeat 5 yrs (Dr. Cristina Gong)  . Total knee arthroplasty  11/2013    Right.  [facility]: Dr. Al Corpus   No family history on file. History  Substance Use Topics  . Smoking status: Never Smoker   . Smokeless tobacco: Never Used  . Alcohol Use: No   OB History    No data available     Review of Systems  Constitutional: Negative for fever.  Respiratory: Negative for cough and shortness of breath.   Genitourinary: Positive for dysuria.  All other systems reviewed and are negative.     Allergies  Review of patient's allergies indicates no known allergies.  Home Medications   Prior to Admission medications   Medication Sig Start Date End Date Taking? Authorizing Provider  buPROPion (WELLBUTRIN XL) 300 MG 24 hr tablet Take 300 mg by mouth daily.    Historical  Provider, MD  phenazopyridine (PYRIDIUM) 200 MG tablet Take 1 tablet (200 mg total) by mouth 3 (three) times daily. 09/03/14   Evelina Bucy, MD  sulfamethoxazole-trimethoprim (BACTRIM DS,SEPTRA DS) 800-160 MG per tablet Take 1 tablet by mouth 2 (two) times daily. 09/03/14   Evelina Bucy, MD   BP 128/75 mmHg  Pulse 99  Temp(Src) 98.3 F (36.8 C) (Oral)  Resp 18  Ht 5\' 6"  (1.676 m)  Wt 155 lb (70.308 kg)  BMI 25.03 kg/m2  SpO2 98% Physical Exam  Constitutional: She is oriented to person, place, and time. She appears well-developed and well-nourished. No distress.  HENT:  Head: Normocephalic and atraumatic.  Mouth/Throat: Oropharynx is clear and moist.  Eyes: EOM are normal. Pupils are equal, round, and reactive to light.  Neck: Normal range of motion. Neck supple.  Cardiovascular: Normal rate and regular rhythm.  Exam reveals no friction rub.   No murmur heard. Pulmonary/Chest: Effort normal and breath sounds normal. No respiratory distress. She has no wheezes. She has no rales.  Abdominal: Soft. She exhibits no distension. There is no tenderness. There is no rebound.  Musculoskeletal: Normal range of motion. She exhibits no edema.  Neurological: She is alert and oriented to person, place, and time.  Skin: She is not diaphoretic.  Nursing note and vitals reviewed.   ED Course  Procedures (including critical care time) Labs Review Labs  Reviewed  URINALYSIS, ROUTINE W REFLEX MICROSCOPIC - Abnormal; Notable for the following:    APPearance CLOUDY (*)    Hgb urine dipstick LARGE (*)    Protein, ur 30 (*)    Urobilinogen, UA 2.0 (*)    Nitrite POSITIVE (*)    Leukocytes, UA SMALL (*)    All other components within normal limits  URINE MICROSCOPIC-ADD ON - Abnormal; Notable for the following:    Bacteria, UA MANY (*)    All other components within normal limits    Imaging Review No results found.   EKG Interpretation None      MDM   Final diagnoses:  Acute cystitis  without hematuria    43F here with dysuria. She is demanding to leave prior to me seeing here. Exam benign, denies any systemic symptoms. Given antibiotics since UA c/w UTI. Stable for discharge.    Evelina Bucy, MD 09/09/14 [phone]

## 2014-09-12 NOTE — Progress Notes (Signed)
[facility] note with CT results and lab results from 04/30/14 on chart

## 2014-09-13 ENCOUNTER — Encounter (HOSPITAL_COMMUNITY)
Admission: RE | Admit: 2014-09-13 | Discharge: 2014-09-13 | Disposition: A | Discharge status: Home / Self Care | Payer: Medicare Other | Source: Ambulatory Visit | Attending: Surgery | Admitting: Surgery

## 2014-09-13 ENCOUNTER — Encounter (HOSPITAL_COMMUNITY): Payer: Self-pay

## 2014-09-13 DIAGNOSIS — Z90711 Acquired absence of uterus with remaining cervical stump: Secondary | ICD-10-CM | POA: Diagnosis not present

## 2014-09-13 DIAGNOSIS — Z538 Procedure and treatment not carried out for other reasons: Secondary | ICD-10-CM | POA: Diagnosis not present

## 2014-09-13 DIAGNOSIS — F419 Anxiety disorder, unspecified: Secondary | ICD-10-CM | POA: Diagnosis not present

## 2014-09-13 DIAGNOSIS — F329 Major depressive disorder, single episode, unspecified: Secondary | ICD-10-CM | POA: Diagnosis not present

## 2014-09-13 DIAGNOSIS — K219 Gastro-esophageal reflux disease without esophagitis: Secondary | ICD-10-CM | POA: Diagnosis not present

## 2014-09-13 DIAGNOSIS — K449 Diaphragmatic hernia without obstruction or gangrene: Secondary | ICD-10-CM | POA: Diagnosis present

## 2014-09-13 HISTORY — DX: Depression, unspecified: F32.A

## 2014-09-13 HISTORY — DX: Major depressive disorder, single episode, unspecified: F32.9

## 2014-09-13 HISTORY — DX: Urinary tract infection, site not specified: N39.0

## 2014-09-13 HISTORY — DX: Gastro-esophageal reflux disease without esophagitis: K21.9

## 2014-09-13 HISTORY — DX: Personal history of other diseases of the digestive system: Z87.19

## 2014-09-13 LAB — CBC
HEMATOCRIT: 38.3 % (ref 36.0–46.0)
HEMOGLOBIN: 12.1 g/dL (ref 12.0–15.0)
MCH: 26.6 pg (ref 26.0–34.0)
MCHC: 31.6 g/dL (ref 30.0–36.0)
MCV: 84.2 fL (ref 78.0–100.0)
Platelets: 211 10*3/uL (ref 150–400)
RBC: 4.55 MIL/uL (ref 3.87–5.11)
RDW: 14 % (ref 11.5–15.5)
WBC: 6.1 10*3/uL (ref 4.0–10.5)

## 2014-09-13 NOTE — Patient Instructions (Signed)
[place]  09/13/2014   Your procedure is scheduled on:       Friday September 15, 2014   Report to [facility] Main Entrance and follow signs to  [place] arrive at 5:30 AM.   Call this number if you have problems the morning of surgery [phone] or Presurgical Testing [phone].   Remember:  Do not eat food or drink liquids :After Midnight.              Remember: follow any bowel prep instructions per MD office.  FLEET ENEMA NIGHT PRIOR TO SURGERY.      Take these medicines the morning of surgery with A SIP OF WATER: BUPROPION [facility])                               You may not have any metal on your body including hair pins and piercings  Do not wear jewelry, make-up, lotions, powders, prefumes or deodorant.  Do not shave body hair  48 hours(2 days) of CHG soap use.               Do not bring valuables to the hospital. [place].  Contacts, dentures or bridgework may not be worn into surgery.  Leave suitcase in the car. After surgery it may be brought to your room.  For patients admitted to the hospital, checkout time is 11:00 AM the day of discharge.   ________________________________________________________________________  [facility] - Preparing for Surgery Before surgery, you can play an important role.  Because skin is not sterile, your skin needs to be as free of germs as possible.  You can reduce the number of germs on your skin by washing with CHG (chlorahexidine gluconate) soap before surgery.  CHG is an antiseptic cleaner which kills germs and bonds with the skin to continue killing germs even after washing. Please DO NOT use if you have an allergy to CHG or antibacterial soaps.  If your skin becomes reddened/irritated stop using the CHG and inform your nurse when you arrive at Short Stay. Do not shave (including legs and underarms) for at least 48 hours prior to the first CHG shower.  You may shave  your face/neck. Please follow these instructions carefully:  1.  Shower with CHG Soap the night before surgery and the  morning of Surgery.  2.  If you choose to wash your hair, wash your hair first as usual with your  normal  shampoo.  3.  After you shampoo, rinse your hair and body thoroughly to remove the  shampoo.                           4.  Use CHG as you would any other liquid soap.  You can apply chg directly  to the skin and wash                       Gently with a scrungie or clean washcloth.  5.  Apply the CHG Soap to your body ONLY FROM THE NECK DOWN.   Do not use on face/ open                           Wound or open sores. Avoid contact with eyes, ears mouth and genitals (private parts).  Wash face,  Genitals (private parts) with your normal soap.             6.  Wash thoroughly, paying special attention to the area where your surgery  will be performed.  7.  Thoroughly rinse your body with warm water from the neck down.  8.  DO NOT shower/wash with your normal soap after using and rinsing off  the CHG Soap.                9.  Pat yourself dry with a clean towel.            10.  Wear clean pajamas.            11.  Place clean sheets on your bed the night of your first shower and do not  sleep with pets. Day of Surgery : Do not apply any lotions/deodorants the morning of surgery.  Please wear clean clothes to the hospital/surgery center.  FAILURE TO FOLLOW THESE INSTRUCTIONS MAY RESULT IN THE CANCELLATION OF YOUR SURGERY PATIENT SIGNATURE_________________________________  NURSE SIGNATURE__________________________________  ________________________________________________________________________

## 2014-09-14 NOTE — H&P (Signed)
Katelyn Cuevas Location: [facility] Patient #: [id number] DOB: [birth year] Married / Language: English / Race: White Female  History of Present Illness Patient words: Large type III mixed hiatus hernia Has had a lot of abdominal pain and dypshagia to certain solids. She has had some episodes of pain which Dr. Si Gaul and he was concerned might be ischemia of her pouch or it could be where something is getting enlarged in her esophagus. I showed her the upper GI series and held this large hiatal hernia is in her chest and impinging on the distal esophagus. It almost appears more like a type II hiatal hernia and may be acting like that. As long as she can control her symptoms she would like to get through the holidays and do this perhaps in January. I told her to do this at [place]. I described the operation in some detail. They are aware of the indications and risks.   She has not had any abdominal surgery. She denies DVT.   Other Problems  Arthritis Gastroesophageal Reflux Disease  Past Surgical History  Cesarean Section - 1 Hip Surgery Left. Hysterectomy (due to cancer) - Partial Knee Surgery Right. Oral Surgery  Diagnostic Studies History Colonoscopy 1-5 years ago Mammogram 1-3 years ago Pap Smear 1-5 years ago  Allergies No Known Drug Allergies11/24/2015  Medication History  BuPROPion HCl ER (XL) (300MG  Tablet ER 24HR, Oral) Active.  Social History  Caffeine use Carbonated beverages, Coffee, Tea. No alcohol use No drug use Tobacco use Never smoker.  Family History  Anesthetic complications Mother. Arthritis Father, Mother. Colon Polyps Father. Prostate Cancer Father.  Pregnancy / Birth History  Age of menopause [age] Contraceptive History Intrauterine device, Oral contraceptives. Gravida 2 Maternal age 28-25 Para 2  Review of Systems General Not Present- Appetite Loss, Chills, Fatigue, Fever, Night Sweats, Weight  Gain and Weight Loss. Skin Not Present- Change in Wart/Mole, Dryness, Hives, Jaundice, New Lesions, Non-Healing Wounds, Rash and Ulcer. HEENT Present- Wears glasses/contact lenses. Not Present- Earache, Hearing Loss, Hoarseness, Nose Bleed, Oral Ulcers, Ringing in the Ears, Seasonal Allergies, Sinus Pain, Sore Throat, Visual Disturbances and Yellow Eyes. Respiratory Not Present- Bloody sputum, Chronic Cough, Difficulty Breathing, Snoring and Wheezing. Breast Not Present- Breast Mass, Breast Pain, Nipple Discharge and Skin Changes. Gastrointestinal Present- Abdominal Pain and Indigestion. Not Present- Bloating, Bloody Stool, Change in Bowel Habits, Chronic diarrhea, Constipation, Difficulty Swallowing, Excessive gas, Gets full quickly at meals, Hemorrhoids, Nausea, Rectal Pain and Vomiting. Female Genitourinary Present- Urgency. Not Present- Frequency, Nocturia, Painful Urination and Pelvic Pain. Musculoskeletal Present- Joint Pain. Not Present- Back Pain, Joint Stiffness, Muscle Pain, Muscle Weakness and Swelling of Extremities. Neurological Not Present- Decreased Memory, Fainting, Headaches, Numbness, Seizures, Tingling, Tremor, Trouble walking and Weakness. Psychiatric Not Present- Anxiety, Bipolar, Change in Sleep Pattern, Depression, Fearful and Frequent crying. Endocrine Not Present- Cold Intolerance, Excessive Hunger, Hair Changes, Heat Intolerance, Hot flashes and New Diabetes.   Vitals  06/27/2014 2:25 PM Weight: 161 lb Height: 66in Body Surface Area: 1.84 m Body Mass Index: 25.99 kg/m Temp.: 8F(Temporal)  Pulse: 78 (Regular)  BP: 124/70 (Sitting, Left Arm, Standard)    Physical Exam   The physical exam findings are as follows: Note:HEENT-negative Neck-supple Chest-clear Heart-SR without murmurs Abdomen-nontender Ext-prior right knee and left hip replacements    Assessment & Plan Rodman Key B. Hassell Done MD;  Large type III mixed hiatus hernia with intermittent  obstruction  Plan:  Reduction of hiatus hernia and repair with Nissen fundoplication  Matt B. Hassell Done,  MD, [facility] [phone] beeper [phone]  09/14/2014 7:10 PM

## 2014-09-15 ENCOUNTER — Ambulatory Visit (HOSPITAL_COMMUNITY)
Admission: RE | Admit: 2014-09-15 | Discharge: 2014-09-15 | Disposition: A | Discharge status: Home / Self Care | Payer: Medicare Other | Source: Ambulatory Visit | Attending: Surgery | Admitting: Surgery

## 2014-09-15 ENCOUNTER — Encounter (HOSPITAL_COMMUNITY)
Admission: RE | Disposition: A | Discharge status: Home / Self Care | Payer: Self-pay | Source: Ambulatory Visit | Attending: Surgery

## 2014-09-15 ENCOUNTER — Inpatient Hospital Stay (HOSPITAL_COMMUNITY): Payer: Medicare Other | Admitting: Anesthesiology

## 2014-09-15 ENCOUNTER — Encounter (HOSPITAL_COMMUNITY): Payer: Self-pay | Admitting: *Deleted

## 2014-09-15 DIAGNOSIS — Z538 Procedure and treatment not carried out for other reasons: Secondary | ICD-10-CM | POA: Insufficient documentation

## 2014-09-15 DIAGNOSIS — K449 Diaphragmatic hernia without obstruction or gangrene: Secondary | ICD-10-CM | POA: Insufficient documentation

## 2014-09-15 DIAGNOSIS — F419 Anxiety disorder, unspecified: Secondary | ICD-10-CM | POA: Insufficient documentation

## 2014-09-15 DIAGNOSIS — F329 Major depressive disorder, single episode, unspecified: Secondary | ICD-10-CM | POA: Insufficient documentation

## 2014-09-15 DIAGNOSIS — Z90711 Acquired absence of uterus with remaining cervical stump: Secondary | ICD-10-CM | POA: Insufficient documentation

## 2014-09-15 DIAGNOSIS — K219 Gastro-esophageal reflux disease without esophagitis: Secondary | ICD-10-CM | POA: Insufficient documentation

## 2014-09-15 SURGERY — REPAIR, HERNIA, HIATAL, LAPAROSCOPIC
Anesthesia: General

## 2014-09-15 MED ORDER — MIDAZOLAM HCL 2 MG/2ML IJ SOLN
INTRAMUSCULAR | Status: AC
  Filled 2014-09-15: qty 2

## 2014-09-15 MED ORDER — PROPOFOL 10 MG/ML IV BOLUS
INTRAVENOUS | Status: AC
  Filled 2014-09-15: qty 20

## 2014-09-15 MED ORDER — FENTANYL CITRATE 0.05 MG/ML IJ SOLN
INTRAMUSCULAR | Status: AC
  Filled 2014-09-15: qty 5

## 2014-09-15 MED ORDER — BUPIVACAINE LIPOSOME 1.3 % IJ SUSP
20.0000 mL | Freq: Once | INTRAMUSCULAR | Status: DC
  Filled 2014-09-15: qty 20

## 2014-09-15 MED ORDER — DEXTROSE 5 % IV SOLN
INTRAVENOUS | Status: AC
  Filled 2014-09-15: qty 2

## 2014-09-15 MED ORDER — LACTATED RINGERS IV SOLN
INTRAVENOUS | Status: DC | PRN
  Administered 2014-09-15: 07:00:00 via INTRAVENOUS

## 2014-09-15 MED ORDER — LIDOCAINE HCL (CARDIAC) 20 MG/ML IV SOLN
INTRAVENOUS | Status: AC
  Filled 2014-09-15: qty 5

## 2014-09-15 MED ORDER — ROCURONIUM BROMIDE 100 MG/10ML IV SOLN
INTRAVENOUS | Status: AC
  Filled 2014-09-15: qty 1

## 2014-09-15 MED ORDER — CEFOXITIN SODIUM 2 G IV SOLR: 2.0000 g | INTRAVENOUS | Status: DC

## 2014-09-15 MED ORDER — HEPARIN SODIUM (PORCINE) 5000 UNIT/ML IJ SOLN
5000.0000 [IU] | Freq: Once | INTRAMUSCULAR | Status: AC
  Administered 2014-09-15: 5000 [IU] via SUBCUTANEOUS
  Filled 2014-09-15: qty 1

## 2014-09-15 MED ORDER — SODIUM CHLORIDE 0.9 % IJ SOLN
INTRAMUSCULAR | Status: AC
  Filled 2014-09-15: qty 50

## 2014-09-15 NOTE — Addendum Note (Signed)
Addendum  created 09/15/14 0758 by Duane Boston, MD   Modules edited: Anesthesia Responsible Staff

## 2014-09-15 NOTE — Anesthesia Preprocedure Evaluation (Addendum)
Anesthesia Evaluation  Patient identified by MRN, date of birth, ID band Patient awake    Reviewed: Allergy & Precautions, NPO status , Patient's Chart, lab work & pertinent test results  History of Anesthesia Complications Negative for: history of anesthetic complications  Airway Mallampati: II  TM Distance: >3 FB Neck ROM: Full    Dental  (+) Teeth Intact, Caps, Dental Advisory Given   Pulmonary neg pulmonary ROS,    Pulmonary exam normal       Cardiovascular negative cardio ROS      Neuro/Psych PSYCHIATRIC DISORDERS Depression negative neurological ROS     GI/Hepatic Neg liver ROS, hiatal hernia, GERD-  ,  Endo/Other  negative endocrine ROS  Renal/GU negative Renal ROS     Musculoskeletal   Abdominal   Peds  Hematology negative hematology ROS (+)   Anesthesia Other Findings   Reproductive/Obstetrics                            Anesthesia Physical Anesthesia Plan  ASA: II  Anesthesia Plan: General   Post-op Pain Management:    Induction: Intravenous  Airway Management Planned: Oral ETT  Additional Equipment:   Intra-op Plan:   Post-operative Plan: Extubation in OR  Informed Consent: I have reviewed the patients History and Physical, chart, labs and discussed the procedure including the risks, benefits and alternatives for the proposed anesthesia with the patient or authorized representative who has indicated his/her understanding and acceptance.   Dental advisory given  Plan Discussed with: CRNA, Anesthesiologist and Surgeon  Anesthesia Plan Comments:        Anesthesia Quick Evaluation

## 2014-09-15 NOTE — Progress Notes (Signed)
Pt cancelled in OR per MD  Discharged to home. DR Hassell Done office to call to reschedule

## 2014-09-15 NOTE — Addendum Note (Signed)
Addendum  created 09/15/14 0816 by Noralyn Pick, CRNA   Modules edited: Anesthesia LDA, Lines/Drains/Airways Properties Editor   Lines/Drains/Airways Properties Editor:  Properties of line/drain/airway/wound [REMOVED] Peripheral IV 09/15/14 Left Wrist have been modified.

## 2014-09-15 NOTE — Discharge Instructions (Signed)
OFFICE WILL CONTACT PATIENT TO RESCHEDULE

## 2014-09-19 ENCOUNTER — Encounter (HOSPITAL_COMMUNITY): Payer: Self-pay | Admitting: *Deleted

## 2014-09-19 NOTE — Progress Notes (Signed)
Please put orders in Epic for Same day surgery 09-27-14 Thanks

## 2014-09-26 ENCOUNTER — Ambulatory Visit (INDEPENDENT_AMBULATORY_CARE_PROVIDER_SITE_OTHER): Payer: Self-pay | Admitting: Surgery

## 2014-09-27 ENCOUNTER — Inpatient Hospital Stay (HOSPITAL_COMMUNITY): Payer: Medicare Other | Admitting: Certified Registered Nurse Anesthetist

## 2014-09-27 ENCOUNTER — Inpatient Hospital Stay (HOSPITAL_COMMUNITY)
Admission: RE | Admit: 2014-09-27 | Discharge: 2014-09-30 | DRG: 327 | Disposition: A | Discharge status: Home / Self Care | Payer: Medicare Other | Source: Ambulatory Visit | Attending: Surgery | Admitting: Surgery

## 2014-09-27 ENCOUNTER — Encounter (HOSPITAL_COMMUNITY): Payer: Self-pay | Admitting: *Deleted

## 2014-09-27 ENCOUNTER — Encounter (HOSPITAL_COMMUNITY)
Admission: RE | Disposition: A | Discharge status: Home / Self Care | Payer: Self-pay | Source: Ambulatory Visit | Attending: Surgery

## 2014-09-27 DIAGNOSIS — K449 Diaphragmatic hernia without obstruction or gangrene: Secondary | ICD-10-CM | POA: Diagnosis present

## 2014-09-27 DIAGNOSIS — Z79899 Other long term (current) drug therapy: Secondary | ICD-10-CM | POA: Diagnosis not present

## 2014-09-27 DIAGNOSIS — M1990 Unspecified osteoarthritis, unspecified site: Secondary | ICD-10-CM | POA: Diagnosis present

## 2014-09-27 DIAGNOSIS — J9811 Atelectasis: Secondary | ICD-10-CM | POA: Diagnosis present

## 2014-09-27 DIAGNOSIS — K219 Gastro-esophageal reflux disease without esophagitis: Secondary | ICD-10-CM | POA: Diagnosis present

## 2014-09-27 DIAGNOSIS — R1310 Dysphagia, unspecified: Secondary | ICD-10-CM | POA: Diagnosis present

## 2014-09-27 DIAGNOSIS — Z809 Family history of malignant neoplasm, unspecified: Secondary | ICD-10-CM

## 2014-09-27 DIAGNOSIS — Z96651 Presence of right artificial knee joint: Secondary | ICD-10-CM

## 2014-09-27 DIAGNOSIS — Z8371 Family history of colonic polyps: Secondary | ICD-10-CM

## 2014-09-27 DIAGNOSIS — Z9889 Other specified postprocedural states: Secondary | ICD-10-CM

## 2014-09-27 DIAGNOSIS — R509 Fever, unspecified: Secondary | ICD-10-CM

## 2014-09-27 HISTORY — PX: HIATAL HERNIA REPAIR: SHX195

## 2014-09-27 LAB — CBC
HEMATOCRIT: 38.2 % (ref 36.0–46.0)
Hemoglobin: 12.2 g/dL (ref 12.0–15.0)
MCH: 26.9 pg (ref 26.0–34.0)
MCHC: 31.9 g/dL (ref 30.0–36.0)
MCV: 84.1 fL (ref 78.0–100.0)
Platelets: 162 10*3/uL (ref 150–400)
RBC: 4.54 MIL/uL (ref 3.87–5.11)
RDW: 14 % (ref 11.5–15.5)
WBC: 9.7 10*3/uL (ref 4.0–10.5)

## 2014-09-27 LAB — CREATININE, SERUM
CREATININE: 0.99 mg/dL (ref 0.50–1.10)
GFR calc Af Amer: 68 mL/min — ABNORMAL LOW (ref >90)
GFR, EST NON AFRICAN AMERICAN: 59 mL/min — AB (ref >90)

## 2014-09-27 LAB — GLUCOSE, CAPILLARY: GLUCOSE-CAPILLARY: 87 mg/dL (ref 70–99)

## 2014-09-27 SURGERY — REPAIR, HERNIA, HIATAL, LAPAROSCOPIC
Anesthesia: General | Site: Abdomen

## 2014-09-27 MED ORDER — SODIUM CHLORIDE 0.9 % IJ SOLN
INTRAMUSCULAR | Status: AC
  Filled 2014-09-27: qty 50

## 2014-09-27 MED ORDER — ROCURONIUM BROMIDE 100 MG/10ML IV SOLN
INTRAVENOUS | Status: DC | PRN
  Administered 2014-09-27: 5 mg via INTRAVENOUS
  Administered 2014-09-27: 10 mg via INTRAVENOUS
  Administered 2014-09-27: 40 mg via INTRAVENOUS

## 2014-09-27 MED ORDER — LIDOCAINE HCL (CARDIAC) 20 MG/ML IV SOLN
INTRAVENOUS | Status: AC
  Filled 2014-09-27: qty 5

## 2014-09-27 MED ORDER — CEFOXITIN SODIUM 1 G IV SOLR
1.0000 g | Freq: Four times a day (QID) | INTRAVENOUS | Status: AC
  Administered 2014-09-27: 1 g via INTRAVENOUS
  Filled 2014-09-27: qty 1

## 2014-09-27 MED ORDER — DEXTROSE 5 % IV SOLN
INTRAVENOUS | Status: AC
  Filled 2014-09-27: qty 2

## 2014-09-27 MED ORDER — PROPOFOL 10 MG/ML IV BOLUS
INTRAVENOUS | Status: DC | PRN
  Administered 2014-09-27: 140 mg via INTRAVENOUS

## 2014-09-27 MED ORDER — HEPARIN SODIUM (PORCINE) 5000 UNIT/ML IJ SOLN
5000.0000 [IU] | Freq: Once | INTRAMUSCULAR | Status: AC
  Administered 2014-09-27: 5000 [IU] via SUBCUTANEOUS
  Filled 2014-09-27: qty 1

## 2014-09-27 MED ORDER — MIDAZOLAM HCL 5 MG/5ML IJ SOLN
INTRAMUSCULAR | Status: DC | PRN
  Administered 2014-09-27: 2 mg via INTRAVENOUS

## 2014-09-27 MED ORDER — SUCCINYLCHOLINE CHLORIDE 20 MG/ML IJ SOLN
INTRAMUSCULAR | Status: DC | PRN
  Administered 2014-09-27: 100 mg via INTRAVENOUS

## 2014-09-27 MED ORDER — 0.9 % SODIUM CHLORIDE (POUR BTL) OPTIME
TOPICAL | Status: DC | PRN
  Administered 2014-09-27: 1000 mL

## 2014-09-27 MED ORDER — CEFOXITIN SODIUM 2 G IV SOLR
INTRAVENOUS | Status: AC
  Filled 2014-09-27: qty 2

## 2014-09-27 MED ORDER — ONDANSETRON HCL 4 MG/2ML IJ SOLN: 4.0000 mg | Freq: Four times a day (QID) | INTRAMUSCULAR | Status: DC | PRN

## 2014-09-27 MED ORDER — FENTANYL CITRATE 0.05 MG/ML IJ SOLN
INTRAMUSCULAR | Status: AC
  Filled 2014-09-27: qty 5

## 2014-09-27 MED ORDER — BUPIVACAINE LIPOSOME 1.3 % IJ SUSP
20.0000 mL | Freq: Once | INTRAMUSCULAR | Status: DC
  Filled 2014-09-27: qty 20

## 2014-09-27 MED ORDER — LIDOCAINE HCL (CARDIAC) 20 MG/ML IV SOLN
INTRAVENOUS | Status: DC | PRN
  Administered 2014-09-27: 100 mg via INTRAVENOUS

## 2014-09-27 MED ORDER — MIDAZOLAM HCL 2 MG/2ML IJ SOLN
INTRAMUSCULAR | Status: AC
  Filled 2014-09-27: qty 2

## 2014-09-27 MED ORDER — KCL IN DEXTROSE-NACL 20-5-0.45 MEQ/L-%-% IV SOLN
INTRAVENOUS | Status: DC
  Administered 2014-09-27: 17:00:00 via INTRAVENOUS
  Administered 2014-09-28: 1000 mL via INTRAVENOUS
  Administered 2014-09-28 – 2014-09-29 (×4): via INTRAVENOUS
  Filled 2014-09-27 (×8): qty 1000

## 2014-09-27 MED ORDER — FENTANYL CITRATE 0.05 MG/ML IJ SOLN
INTRAMUSCULAR | Status: DC | PRN
  Administered 2014-09-27 (×2): 50 ug via INTRAVENOUS
  Administered 2014-09-27: 25 ug via INTRAVENOUS
  Administered 2014-09-27 (×2): 50 ug via INTRAVENOUS
  Administered 2014-09-27: 25 ug via INTRAVENOUS

## 2014-09-27 MED ORDER — NEOSTIGMINE METHYLSULFATE 10 MG/10ML IV SOLN
INTRAVENOUS | Status: DC | PRN
  Administered 2014-09-27: 5 mg via INTRAVENOUS

## 2014-09-27 MED ORDER — NEOSTIGMINE METHYLSULFATE 10 MG/10ML IV SOLN
INTRAVENOUS | Status: AC
  Filled 2014-09-27: qty 1

## 2014-09-27 MED ORDER — PROPOFOL 10 MG/ML IV BOLUS
INTRAVENOUS | Status: AC
Start: 2014-09-27 — End: 2014-09-27
  Filled 2014-09-27: qty 20

## 2014-09-27 MED ORDER — HEPARIN SODIUM (PORCINE) 5000 UNIT/ML IJ SOLN
5000.0000 [IU] | Freq: Three times a day (TID) | INTRAMUSCULAR | Status: DC
  Administered 2014-09-28 – 2014-09-29 (×6): 5000 [IU] via SUBCUTANEOUS
  Filled 2014-09-27 (×10): qty 1

## 2014-09-27 MED ORDER — PHENYLEPHRINE HCL 10 MG/ML IJ SOLN
INTRAMUSCULAR | Status: DC | PRN
  Administered 2014-09-27 (×2): 80 ug via INTRAVENOUS

## 2014-09-27 MED ORDER — GLYCOPYRROLATE 0.2 MG/ML IJ SOLN
INTRAMUSCULAR | Status: AC
  Filled 2014-09-27: qty 3

## 2014-09-27 MED ORDER — DEXAMETHASONE SODIUM PHOSPHATE 10 MG/ML IJ SOLN
INTRAMUSCULAR | Status: DC | PRN
  Administered 2014-09-27: 10 mg via INTRAVENOUS

## 2014-09-27 MED ORDER — ONDANSETRON HCL 4 MG PO TABS: 4.0000 mg | ORAL_TABLET | Freq: Four times a day (QID) | ORAL | Status: DC | PRN

## 2014-09-27 MED ORDER — DEXAMETHASONE SODIUM PHOSPHATE 10 MG/ML IJ SOLN
INTRAMUSCULAR | Status: AC
  Filled 2014-09-27: qty 1

## 2014-09-27 MED ORDER — LACTATED RINGERS IR SOLN
Status: DC | PRN
  Administered 2014-09-27: 1000 mL

## 2014-09-27 MED ORDER — LACTATED RINGERS IV SOLN: INTRAVENOUS | Status: DC

## 2014-09-27 MED ORDER — BUPIVACAINE LIPOSOME 1.3 % IJ SUSP
INTRAMUSCULAR | Status: DC | PRN
  Administered 2014-09-27: 30 mL

## 2014-09-27 MED ORDER — PHENYLEPHRINE 40 MCG/ML (10ML) SYRINGE FOR IV PUSH (FOR BLOOD PRESSURE SUPPORT)
PREFILLED_SYRINGE | INTRAVENOUS | Status: AC
  Filled 2014-09-27: qty 10

## 2014-09-27 MED ORDER — PROMETHAZINE HCL 25 MG/ML IJ SOLN: 6.2500 mg | INTRAMUSCULAR | Status: DC | PRN

## 2014-09-27 MED ORDER — GLYCOPYRROLATE 0.2 MG/ML IJ SOLN
INTRAMUSCULAR | Status: DC | PRN
  Administered 2014-09-27: 0.6 mg via INTRAVENOUS

## 2014-09-27 MED ORDER — ROCURONIUM BROMIDE 100 MG/10ML IV SOLN
INTRAVENOUS | Status: AC
  Filled 2014-09-27: qty 1

## 2014-09-27 MED ORDER — HYDROMORPHONE HCL 1 MG/ML IJ SOLN
INTRAMUSCULAR | Status: AC
  Filled 2014-09-27: qty 1

## 2014-09-27 MED ORDER — DEXTROSE 5 % IV SOLN
2.0000 g | INTRAVENOUS | Status: AC
  Administered 2014-09-27 (×2): 2 g via INTRAVENOUS

## 2014-09-27 MED ORDER — ONDANSETRON HCL 4 MG/2ML IJ SOLN
INTRAMUSCULAR | Status: AC
  Filled 2014-09-27: qty 2

## 2014-09-27 MED ORDER — ONDANSETRON HCL 4 MG/2ML IJ SOLN
INTRAMUSCULAR | Status: DC | PRN
  Administered 2014-09-27: 4 mg via INTRAVENOUS

## 2014-09-27 MED ORDER — LACTATED RINGERS IV SOLN
INTRAVENOUS | Status: DC
Start: 2014-09-27 — End: 2014-09-27
  Administered 2014-09-27: 1000 mL via INTRAVENOUS
  Administered 2014-09-27: 13:00:00 via INTRAVENOUS
  Administered 2014-09-27: 1 via INTRAVENOUS

## 2014-09-27 MED ORDER — MORPHINE SULFATE 2 MG/ML IJ SOLN
1.0000 mg | INTRAMUSCULAR | Status: DC | PRN
  Administered 2014-09-27 – 2014-09-28 (×3): 1 mg via INTRAVENOUS
  Filled 2014-09-27 (×3): qty 1

## 2014-09-27 MED ORDER — CHLORHEXIDINE GLUCONATE 4 % EX LIQD: 1.0000 "application " | Freq: Once | CUTANEOUS | Status: DC

## 2014-09-27 MED ORDER — HYDROMORPHONE HCL 1 MG/ML IJ SOLN
0.2500 mg | INTRAMUSCULAR | Status: DC | PRN
  Administered 2014-09-27 (×2): 0.5 mg via INTRAVENOUS

## 2014-09-27 MED ORDER — MEPERIDINE HCL 50 MG/ML IJ SOLN: 6.2500 mg | INTRAMUSCULAR | Status: DC | PRN

## 2014-09-27 SURGICAL SUPPLY — 49 items
APL SKNCLS STERI-STRIP NONHPOA (GAUZE/BANDAGES/DRESSINGS)
APPLIER CLIP ROT 10 11.4 M/L (STAPLE)
APR CLP MED LRG 11.4X10 (STAPLE)
BENZOIN TINCTURE PRP APPL 2/3 (GAUZE/BANDAGES/DRESSINGS) IMPLANT
CABLE HIGH FREQUENCY MONO STRZ (ELECTRODE) ×2 IMPLANT
CLAMP ENDO BABCK 10MM (STAPLE) IMPLANT
CLIP APPLIE ROT 10 11.4 M/L (STAPLE) IMPLANT
CLOSURE WOUND 1/2 X4 (GAUZE/BANDAGES/DRESSINGS)
DECANTER SPIKE VIAL GLASS SM (MISCELLANEOUS) ×3 IMPLANT
DEVICE SUT QUICK LOAD TK 5 (STAPLE) ×6 IMPLANT
DEVICE SUT TI-KNOT TK 5X26 (MISCELLANEOUS) ×1 IMPLANT
DEVICE SUTURE ENDOST 10MM (ENDOMECHANICALS) ×3 IMPLANT
DEVICE TI KNOT TK5 (MISCELLANEOUS) ×1
DISSECTOR BLUNT TIP ENDO 5MM (MISCELLANEOUS) ×1 IMPLANT
DRAIN PENROSE 18X1/2 LTX STRL (DRAIN) ×3 IMPLANT
DRAPE LAPAROSCOPIC ABDOMINAL (DRAPES) ×3 IMPLANT
ELECT REM PT RETURN 9FT ADLT (ELECTROSURGICAL) ×3
ELECTRODE REM PT RTRN 9FT ADLT (ELECTROSURGICAL) ×1 IMPLANT
GLOVE BIOGEL M 8.0 STRL (GLOVE) ×3 IMPLANT
GOWN STRL REUS W/TWL XL LVL3 (GOWN DISPOSABLE) ×12 IMPLANT
GRASPER ENDO BABCOCK 10 (MISCELLANEOUS) IMPLANT
GRASPER ENDO BABCOCK 10MM (MISCELLANEOUS)
KIT BASIN OR (CUSTOM PROCEDURE TRAY) ×3 IMPLANT
LIQUID BAND (GAUZE/BANDAGES/DRESSINGS) ×1 IMPLANT
NDL SPNL 22GX3.5 QUINCKE BK (NEEDLE) ×1 IMPLANT
NEEDLE SPNL 22GX3.5 QUINCKE BK (NEEDLE) ×3 IMPLANT
PENCIL BUTTON HOLSTER BLD 10FT (ELECTRODE) ×2 IMPLANT
QUICK LOAD TK 5 (STAPLE) ×6
SCISSORS LAP 5X45 EPIX DISP (ENDOMECHANICALS) ×3 IMPLANT
SCRUB PCMX 4 OZ (MISCELLANEOUS) ×3 IMPLANT
SET IRRIG TUBING LAPAROSCOPIC (IRRIGATION / IRRIGATOR) ×3 IMPLANT
SHEARS HARMONIC ACE PLUS 45CM (MISCELLANEOUS) ×3 IMPLANT
SLEEVE ADV FIXATION 5X100MM (TROCAR) ×2 IMPLANT
SOLUTION ANTI FOG 6CC (MISCELLANEOUS) ×1 IMPLANT
STAPLER VISISTAT 35W (STAPLE) ×3 IMPLANT
STRIP CLOSURE SKIN 1/2X4 (GAUZE/BANDAGES/DRESSINGS) IMPLANT
SUT SURGIDAC NAB ES-9 0 48 120 (SUTURE) ×16 IMPLANT
SUT VIC AB 4-0 SH 18 (SUTURE) ×3 IMPLANT
TIP INNERVISION DETACH 40FR (MISCELLANEOUS) IMPLANT
TIP INNERVISION DETACH 50FR (MISCELLANEOUS) IMPLANT
TIP INNERVISION DETACH 56FR (MISCELLANEOUS) ×2 IMPLANT
TIPS INNERVISION DETACH 40FR (MISCELLANEOUS)
TOWEL OR 17X26 10 PK STRL BLUE (TOWEL DISPOSABLE) ×3 IMPLANT
TRAY FOLEY CATH 14FRSI W/METER (CATHETERS) ×3 IMPLANT
TRAY LAPAROSCOPIC (CUSTOM PROCEDURE TRAY) ×3 IMPLANT
TROCAR ADV FIXATION 11X100MM (TROCAR) ×2 IMPLANT
TROCAR ADV FIXATION 5X100MM (TROCAR) ×2 IMPLANT
TROCAR BLADELESS OPT 5 100 (ENDOMECHANICALS) ×3 IMPLANT
TUBING FILTER THERMOFLATOR (ELECTROSURGICAL) ×3 IMPLANT

## 2014-09-27 NOTE — H&P (View-Only) (Signed)
Katelyn Cuevas Location: [facility] Patient #: [id number] DOB: [birth year] Married / Language: English / Race: White Female  History of Present Illness Patient words: Large type III mixed hiatus hernia Has had a lot of abdominal pain and dypshagia to certain solids. She has had some episodes of pain which Dr. Si Gaul and he was concerned might be ischemia of her pouch or it could be where something is getting enlarged in her esophagus. I showed her the upper GI series and held this large hiatal hernia is in her chest and impinging on the distal esophagus. It almost appears more like a type II hiatal hernia and may be acting like that. As long as she can control her symptoms she would like to get through the holidays and do this perhaps in January. I told her to do this at [place]. I described the operation in some detail. They are aware of the indications and risks.   She has not had any abdominal surgery. She denies DVT.   Other Problems  Arthritis Gastroesophageal Reflux Disease  Past Surgical History  Cesarean Section - 1 Hip Surgery Left. Hysterectomy (due to cancer) - Partial Knee Surgery Right. Oral Surgery  Diagnostic Studies History Colonoscopy 1-5 years ago Mammogram 1-3 years ago Pap Smear 1-5 years ago  Allergies No Known Drug Allergies11/24/2015  Medication History  BuPROPion HCl ER (XL) (300MG  Tablet ER 24HR, Oral) Active.  Social History  Caffeine use Carbonated beverages, Coffee, Tea. No alcohol use No drug use Tobacco use Never smoker.  Family History  Anesthetic complications Mother. Arthritis Father, Mother. Colon Polyps Father. Prostate Cancer Father.  Pregnancy / Birth History  Age of menopause 75-60 Contraceptive History Intrauterine device, Oral contraceptives. Gravida 2 Maternal age 79-25 Para 2  Review of Systems General Not Present- Appetite Loss, Chills, Fatigue, Fever, Night Sweats, Weight  Gain and Weight Loss. Skin Not Present- Change in Wart/Mole, Dryness, Hives, Jaundice, New Lesions, Non-Healing Wounds, Rash and Ulcer. HEENT Present- Wears glasses/contact lenses. Not Present- Earache, Hearing Loss, Hoarseness, Nose Bleed, Oral Ulcers, Ringing in the Ears, Seasonal Allergies, Sinus Pain, Sore Throat, Visual Disturbances and Yellow Eyes. Respiratory Not Present- Bloody sputum, Chronic Cough, Difficulty Breathing, Snoring and Wheezing. Breast Not Present- Breast Mass, Breast Pain, Nipple Discharge and Skin Changes. Gastrointestinal Present- Abdominal Pain and Indigestion. Not Present- Bloating, Bloody Stool, Change in Bowel Habits, Chronic diarrhea, Constipation, Difficulty Swallowing, Excessive gas, Gets full quickly at meals, Hemorrhoids, Nausea, Rectal Pain and Vomiting. Female Genitourinary Present- Urgency. Not Present- Frequency, Nocturia, Painful Urination and Pelvic Pain. Musculoskeletal Present- Joint Pain. Not Present- Back Pain, Joint Stiffness, Muscle Pain, Muscle Weakness and Swelling of Extremities. Neurological Not Present- Decreased Memory, Fainting, Headaches, Numbness, Seizures, Tingling, Tremor, Trouble walking and Weakness. Psychiatric Not Present- Anxiety, Bipolar, Change in Sleep Pattern, Depression, Fearful and Frequent crying. Endocrine Not Present- Cold Intolerance, Excessive Hunger, Hair Changes, Heat Intolerance, Hot flashes and New Diabetes.   Vitals  06/27/2014 2:25 PM Weight: 161 lb Height: 66in Body Surface Area: 1.84 m Body Mass Index: 25.99 kg/m Temp.: 47F(Temporal)  Pulse: 78 (Regular)  BP: 124/70 (Sitting, Left Arm, Standard)    Physical Exam   The physical exam findings are as follows: Note:HEENT-negative Neck-supple Chest-clear Heart-SR without murmurs Abdomen-nontender Ext-prior right knee and left hip replacements    Assessment & Plan Rodman Key B. Hassell Done MD;  Large type III mixed hiatus hernia with intermittent  obstruction  Plan:  Reduction of hiatus hernia and repair with Nissen fundoplication  Matt B. Hassell Done,  MD, [facility] [phone] beeper [phone]  09/14/2014 7:10 PM

## 2014-09-27 NOTE — Transfer of Care (Signed)
Immediate Anesthesia Transfer of Care Note  Patient: Katelyn Cuevas  Procedure(s) Performed: Procedure(s) (LRB): Laparoscopic takedown of large type III hiatus hernia and Nissen fundoplication over #09 dilator (N/A)  Patient Location: PACU  Anesthesia Type: General  Level of Consciousness: sedated, patient cooperative and responds to stimulation  Airway & Oxygen Therapy: Patient Spontanous Breathing and Patient connected to face mask oxgen  Post-op Assessment: Report given to PACU RN and Post -op Vital signs reviewed and stable  Post vital signs: Reviewed and stable  Complications: No apparent anesthesia complications

## 2014-09-27 NOTE — Anesthesia Preprocedure Evaluation (Addendum)
Anesthesia Evaluation  Patient identified by MRN, date of birth, ID band Patient awake    Reviewed: Allergy & Precautions, NPO status , Patient's Chart, lab work & pertinent test results  Airway Mallampati: II  TM Distance: >3 FB Neck ROM: Full    Dental no notable dental hx.    Pulmonary neg pulmonary ROS,  breath sounds clear to auscultation  Pulmonary exam normal       Cardiovascular negative cardio ROS  Rhythm:Regular Rate:Normal     Neuro/Psych negative neurological ROS  negative psych ROS   GI/Hepatic Neg liver ROS, hiatal hernia, GERD-  Poorly Controlled,  Endo/Other  negative endocrine ROS  Renal/GU negative Renal ROS  negative genitourinary   Musculoskeletal negative musculoskeletal ROS (+)   Abdominal   Peds negative pediatric ROS (+)  Hematology negative hematology ROS (+)   Anesthesia Other Findings   Reproductive/Obstetrics negative OB ROS                             Anesthesia Physical Anesthesia Plan  ASA: II  Anesthesia Plan: General   Post-op Pain Management:    Induction: Intravenous, Rapid sequence and Cricoid pressure planned  Airway Management Planned: Oral ETT  Additional Equipment:   Intra-op Plan:   Post-operative Plan: Extubation in OR  Informed Consent: I have reviewed the patients History and Physical, chart, labs and discussed the procedure including the risks, benefits and alternatives for the proposed anesthesia with the patient or authorized representative who has indicated his/her understanding and acceptance.   Dental advisory given  Plan Discussed with: CRNA  Anesthesia Plan Comments:         Anesthesia Quick Evaluation

## 2014-09-27 NOTE — Interval H&P Note (Signed)
History and Physical Interval Note:  09/27/2014 12:25 PM  Katelyn Cuevas  has presented today for surgery, with the diagnosis of HIATAL HERNIA  The various methods of treatment have been discussed with the patient and family. After consideration of risks, benefits and other options for treatment, the patient has consented to  Procedure(s): [place] (N/A) as a surgical intervention .  The patient's history has been reviewed, patient examined, no change in status, stable for surgery.  I have reviewed the patient's chart and labs.  Questions were answered to the patient's satisfaction.     [name]

## 2014-09-27 NOTE — Brief Op Note (Signed)
09/27/2014  2:34 PM  PATIENT:  Katelyn Cuevas  64 y.o. female  PRE-OPERATIVE DIAGNOSIS:  HIATAL HERNIA  POST-OPERATIVE DIAGNOSIS:  HIATAL HERNIA  PROCEDURE:  Procedure(s): Laparoscopic takedown of large type III hiatus hernia and Nissen fundoplication over #75 dilator (N/A)  SURGEON:  Surgeon(s) and Role:    * Pedro Earls, MD - Primary  PHYSICIAN ASSISTANT:   ASSISTANTS: Leighton Ruff, MD, FACS   ANESTHESIA:   general  EBL:  Total I/O In: 1000 [I.V.:1000] Out: 400 [Urine:400]  BLOOD ADMINISTERED:none  DRAINS: none   LOCAL MEDICATIONS USED:  BUPIVICAINE   SPECIMEN:  No Specimen  DISPOSITION OF SPECIMEN:  N/A  COUNTS:  YES  TOURNIQUET:  * No tourniquets in log *  DICTATION: .Other Dictation: Dictation Number [id number]  PLAN OF CARE: Admit to inpatient   PATIENT DISPOSITION:  PACU - hemodynamically stable.   Delay start of Pharmacological VTE agent (>24hrs) due to surgical blood loss or risk of bleeding: yes

## 2014-09-27 NOTE — Anesthesia Procedure Notes (Signed)
Procedure Name: Intubation Date/Time: 09/27/2014 12:35 PM Performed by: Maxwell Caul Pre-anesthesia Checklist: Patient identified, Emergency Drugs available, Suction available and Patient being monitored Patient Re-evaluated:Patient Re-evaluated prior to inductionOxygen Delivery Method: Circle System Utilized Preoxygenation: Pre-oxygenation with 100% oxygen Intubation Type: IV induction Ventilation: Mask ventilation without difficulty Laryngoscope Size: Mac and 4 Grade View: Grade II Tube type: Oral Number of attempts: 1 Airway Equipment and Method: Stylet and Oral airway Placement Confirmation: ETT inserted through vocal cords under direct vision,  positive ETCO2 and breath sounds checked- equal and bilateral Secured at: 19 cm Tube secured with: Tape Dental Injury: Teeth and Oropharynx as per pre-operative assessment

## 2014-09-28 ENCOUNTER — Inpatient Hospital Stay (HOSPITAL_COMMUNITY): Payer: Medicare Other

## 2014-09-28 ENCOUNTER — Encounter (HOSPITAL_COMMUNITY): Payer: Self-pay | Admitting: Surgery

## 2014-09-28 LAB — BASIC METABOLIC PANEL
ANION GAP: 7 (ref 5–15)
BUN: 10 mg/dL (ref 6–23)
CHLORIDE: 106 mmol/L (ref 96–112)
CO2: 26 mmol/L (ref 19–32)
Calcium: 8.8 mg/dL (ref 8.4–10.5)
Creatinine, Ser: 0.82 mg/dL (ref 0.50–1.10)
GFR calc Af Amer: 85 mL/min — ABNORMAL LOW (ref >90)
GFR calc non Af Amer: 73 mL/min — ABNORMAL LOW (ref >90)
Glucose, Bld: 132 mg/dL — ABNORMAL HIGH (ref 70–99)
POTASSIUM: 4 mmol/L (ref 3.5–5.1)
Sodium: 139 mmol/L (ref 135–145)

## 2014-09-28 LAB — CBC
HCT: 36.7 % (ref 36.0–46.0)
Hemoglobin: 11.8 g/dL — ABNORMAL LOW (ref 12.0–15.0)
MCH: 27.1 pg (ref 26.0–34.0)
MCHC: 32.2 g/dL (ref 30.0–36.0)
MCV: 84.4 fL (ref 78.0–100.0)
Platelets: 186 10*3/uL (ref 150–400)
RBC: 4.35 MIL/uL (ref 3.87–5.11)
RDW: 14.1 % (ref 11.5–15.5)
WBC: 9.1 10*3/uL (ref 4.0–10.5)

## 2014-09-28 MED ORDER — ACETAMINOPHEN 160 MG/5ML PO SOLN
650.0000 mg | Freq: Four times a day (QID) | ORAL | Status: DC | PRN
  Administered 2014-09-28 – 2014-09-29 (×4): 650 mg via ORAL
  Filled 2014-09-28 (×4): qty 20.3

## 2014-09-28 MED ORDER — IOHEXOL 300 MG/ML  SOLN
150.0000 mL | Freq: Once | INTRAMUSCULAR | Status: AC | PRN
  Administered 2014-09-28: 75 mL via ORAL

## 2014-09-28 MED ORDER — OXYCODONE HCL 5 MG/5ML PO SOLN: 5.0000 mg | ORAL | Status: DC | PRN

## 2014-09-28 NOTE — Op Note (Signed)
NAMEMarland Kitchen  Katelyn, Cuevas          ACCOUNT NO.:  [id number]  MEDICAL RECORD NO.:  [id number]  LOCATION:  62                         FACILITY:  [facility]  PHYSICIAN:  Isabel Caprice. Hassell Done, MD  DATE OF BIRTH:  Feb 06, 1951  DATE OF PROCEDURE: DATE OF DISCHARGE:                              OPERATIVE REPORT   PREOPERATIVE INDICATIONS:  A 64 year old lady with increasing dysphagia and problems from a large type 3 mixed hiatal hernia.  PROCEDURE:  Laparoscopic takedown of large type 3 mixed hiatal hernia, repair of diaphragm with 4 posterior placed sutures without pledgets and Nissen fundoplication over a #62 bougie.  SURGEON:  Isabel Caprice. Hassell Done, MD  ASSISTANT: Leighton Ruff, MD,   DESCRIPTION OF PROCEDURE:  The patient was taken to room #1 on the afternoon of September 27, 2014, and given general anesthesia.  The abdomen was prepped with PCMX and draped sterilely.  Abdomen was entered through the left upper quadrant using a 5 mm 0 degree without difficulty.  Following insufflation, port was placed down to the left of the umbilicus for the camera, which was a 5-mm angle 30-degree scope ultimately.  An 11 was placed obliquely on the right of the umbilicus and some adhesions were taken down to the anterior abdominal wall and then another 5 was placed laterally.  A 5-mm trocar was placed and removed for the Nathanson retractor to retract the liver to expose the hiatal hernia.  The patient was placed in head up, and the table was adjusted for ergonomics.  I was able to reduce the stomach pretty much with manual distraction and about a third of the stomach was in the chest.  I incised the hepatogastric window and then took the sac down off the right crus beginning posteriorly carrying anteriorly and pulling down and stripping the sac out as I came along.  We then went over on the left side and took down short gastrics and worked our way up to reveal the left crus.  At that point, we got behind  it with a finger dissector and placed a Penrose drain to hold down the entire stomach. It was able to stay in the abdomen easily without any tension.  We went ahead and took down more adhesions to free up the distal esophagus completely.  We passed the lighted bougie to check our anatomy and the alignment looked good.  When that had been done, the bougie was pulled back a little bit and I did a posterior crural repair.  These were 0 Surgidac secured with tie knots.  There were 4 such sutures placed getting good bites of what appeared to be a stronger left diaphragm crus and the thinner right diaphragmatic crus.  Both of those seemed substantial enough.  I completed that posterior closure.  This was done and then the bougie was passed into the distal stomach with a transition point there at the EG junction.  With that in place, it pretty well occupied the entire hiatal opening after closure.  I therefore did not feel it needed anymore sutures.  I then had stomach freed up and went around behind and pulled a free segment of stomach behind and did a shoeshine maneuver.  I then  with the 56 lighted bougie in place secured those with 3 endo stitches taking bites of stomach, esophagus, periesophageal tissue, and the wrapped stomach.  The stomach to stomach wrap was completed in a standard fashion.  Good wrap over the 56 dilator was completed.  Everything looked good and healthy.  The bougie was withdrawn.  The wrap appeared good and intact and secure and floppy enough.  No bleeding was noted.  We inspected the spleen along the stomach.  Everything appeared to be viable. Port sites were injected with Exparel.  The abdomen was deflated after inspecting again and then the wounds were closed with 4-0 Vicryl and Dermabond.  The patient tolerated the procedure well and was taken to the recovery room in satisfactory condition.     Isabel Caprice Hassell Done, MD     MBM/MEDQ  D:  09/27/2014  T:   09/28/2014  Job:  [id number]  cc:   Ronald Lobo, M.D. Fax: [phone]

## 2014-09-28 NOTE — Progress Notes (Signed)
Patient arrived back to unit with transportation. Pt in NAD. Pt alert and oriented. Pt stated that they did not find any "leaks." Pt educated about possibility of eating some food or liquids and that I am just awaiting orders from physician.

## 2014-09-28 NOTE — Progress Notes (Signed)
Called with complaint of a headache.  I will give her liquid tylenol and oxycodone PRN for pain.  She is only on clear liquids at this point.

## 2014-09-28 NOTE — Anesthesia Postprocedure Evaluation (Signed)
  Anesthesia Post-op Note  Patient: Katelyn Cuevas  Procedure(s) Performed: Procedure(s) (LRB): Laparoscopic takedown of large type III hiatus hernia and Nissen fundoplication over #85 dilator (N/A)  Patient Location: PACU  Anesthesia Type: General  Level of Consciousness: awake and alert   Airway and Oxygen Therapy: Patient Spontanous Breathing  Post-op Pain: mild  Post-op Assessment: Post-op Vital signs reviewed, Patient's Cardiovascular Status Stable, Respiratory Function Stable, Patent Airway and No signs of Nausea or vomiting  Last Vitals:  Filed Vitals:   09/28/14 0130  BP: 113/60  Pulse: 92  Temp: 37.1 C  Resp: 14    Post-op Vital Signs: stable   Complications: No apparent anesthesia complications

## 2014-09-28 NOTE — Progress Notes (Signed)
Patient ID: Katelyn Cuevas, female   DOB: Jul 17, 1951, 64 y.o.   MRN: [id number] [facility] Surgery Progress Note:   1 Day Post-Op  Subjective: Mental status is clear.  Objective: Vital signs in last 24 hours: Temp:  [97.3 F (36.3 C)-98.9 F (37.2 C)] 98.9 F (37.2 C) (02/25 0900) Pulse Rate:  [68-95] 86 (02/25 0900) Resp:  [14-25] 16 (02/25 0900) BP: (104-133)/(51-86) 106/65 mmHg (02/25 0900) SpO2:  [97 %-100 %] 98 % (02/25 0900)  Intake/Output from previous day: 02/24 0701 - 02/25 0700 In: 3296.7 [I.V.:3296.7] Out: 1300 [Urine:1300] Intake/Output this shift: Total I/O In: -  Out: 300 [Urine:300]  Physical Exam: Work of breathing is normal.  Feeling good  Lab Results:  Results for orders placed or performed during the hospital encounter of 09/27/14 (from the past 48 hour(s))  Glucose, capillary     Status: None   Collection Time: 09/27/14  9:30 AM  Result Value Ref Range   Glucose-Capillary 87 70 - 99 mg/dL  CBC     Status: None   Collection Time: 09/27/14  5:30 PM  Result Value Ref Range   WBC 9.7 4.0 - 10.5 K/uL   RBC 4.54 3.87 - 5.11 MIL/uL   Hemoglobin 12.2 12.0 - 15.0 g/dL   HCT 38.2 36.0 - 46.0 %   MCV 84.1 78.0 - 100.0 fL   MCH 26.9 26.0 - 34.0 pg   MCHC 31.9 30.0 - 36.0 g/dL   RDW 14.0 11.5 - 15.5 %   Platelets 162 150 - 400 K/uL  Creatinine, serum     Status: Abnormal   Collection Time: 09/27/14  5:30 PM  Result Value Ref Range   Creatinine, Ser 0.99 0.50 - 1.10 mg/dL   GFR calc non Af Amer 59 (L) >90 mL/min   GFR calc Af Amer 68 (L) >90 mL/min    Comment: (NOTE) The eGFR has been calculated using the CKD EPI equation. This calculation has not been validated in all clinical situations. eGFR's persistently <90 mL/min signify possible Chronic Kidney Disease.   CBC     Status: Abnormal   Collection Time: 09/28/14  5:21 AM  Result Value Ref Range   WBC 9.1 4.0 - 10.5 K/uL   RBC 4.35 3.87 - 5.11 MIL/uL   Hemoglobin 11.8 (L) 12.0 - 15.0 g/dL    HCT 36.7 36.0 - 46.0 %   MCV 84.4 78.0 - 100.0 fL   MCH 27.1 26.0 - 34.0 pg   MCHC 32.2 30.0 - 36.0 g/dL   RDW 14.1 11.5 - 15.5 %   Platelets 186 150 - 400 K/uL  Basic metabolic panel     Status: Abnormal   Collection Time: 09/28/14  5:21 AM  Result Value Ref Range   Sodium 139 135 - 145 mmol/L   Potassium 4.0 3.5 - 5.1 mmol/L   Chloride 106 96 - 112 mmol/L   CO2 26 19 - 32 mmol/L   Glucose, Bld 132 (H) 70 - 99 mg/dL   BUN 10 6 - 23 mg/dL   Creatinine, Ser 0.82 0.50 - 1.10 mg/dL   Calcium 8.8 8.4 - 10.5 mg/dL   GFR calc non Af Amer 73 (L) >90 mL/min   GFR calc Af Amer 85 (L) >90 mL/min    Comment: (NOTE) The eGFR has been calculated using the CKD EPI equation. This calculation has not been validated in all clinical situations. eGFR's persistently <90 mL/min signify possible Chronic Kidney Disease.    Anion gap 7 5 -  15    Radiology/Results: Dg Ugi W/water Sol Cm  09/28/2014   CLINICAL DATA:  Patient status post Nissen fundoplication for hiatal hernia.  EXAM: WATER SOLUBLE UPPER GI SERIES  TECHNIQUE: Single-column upper GI series was performed using water soluble contrast.  CONTRAST:  91m OMNIPAQUE IOHEXOL 300 MG/ML  SOLN  COMPARISON:  Upper GI 06/15/2014  FLUOROSCOPY TIME:  Radiation Exposure Index (as provided by the fluoroscopic device): N/A  If the device does not provide the exposure index:  Fluoroscopy Time (in minutes and seconds):  1 minutes 1 second  Number of Acquired Images:  9  FINDINGS: Patient was administered water-soluble contrast by mouth. Contrast flows readily through the GE junction which is below the diaphragm. The entirety of stomach is below the diaphragm. Indentation along the distal esophagus is noted at the fundoplication wrap. There is no evidence of leak or obstruction. Contrast flows into the second portion the duodenum. There is some residual contrast within the esophagus.  IMPRESSION: 1. No leak or obstruction following Nissen fundoplication. 2.  Entirety of stomach is below the diaphragm as well as the GE junction. 3. Mild residual within the esophagus.   Electronically Signed   By: SSuzy BouchardM.D.   On: 09/28/2014 10:35    Anti-infectives: Anti-infectives    Start     Dose/Rate Route Frequency Ordered Stop   09/27/14 1800  cefOXitin (MEFOXIN) 1 g in dextrose 5 % 50 mL IVPB     1 g 100 mL/hr over 30 Minutes Intravenous 4 times per day 09/27/14 1610 09/27/14 1732   09/27/14 0940  cefOXitin (MEFOXIN) 2 g in dextrose 5 % 50 mL IVPB     2 g 100 mL/hr over 30 Minutes Intravenous On call to O.R. 09/27/14 0940 09/27/14 1428      Assessment/Plan: Problem List: Patient Active Problem List   Diagnosis Date Noted  . Status post laparoscopic Nissen fundoplication and repair of large type III hernia Feb 2016 09/27/2014  . Tubular adenoma of colon 05/02/2013  . VITAMIN D DEFICIENCY 12/09/2007  . DEPRESSIVE DISORDER NOT ELSEWHERE CLASSIFIED 12/09/2007  . PAIN IN JOINT, SITE UNSPECIFIED 12/03/2007  . UNSPECIFIED MYALGIA AND MYOSITIS 12/03/2007  . EDEMA 12/03/2007    Swallow looked good.  Will begin clear liquids PO 1 Day Post-Op    LOS: 1 day   Matt B. MHassell Done MD, FKindred [facility], [facility] [phone] [phone] 09/28/2014 11:00 AM

## 2014-09-28 NOTE — Care Management Note (Signed)
    Page 1 of 1   09/28/2014     2:05:44 PM CARE MANAGEMENT NOTE 09/28/2014  Patient:  Katelyn Cuevas, Katelyn Cuevas   Account Number:  [id number]  Date Initiated:  09/28/2014  Documentation initiated by:  Dessa Phi  Subjective/Objective Assessment:   64 y/o f admitted w/hiatal hernia.     Action/Plan:   From home.   Anticipated DC Date:  09/29/2014   Anticipated DC Plan:  [place]  CM consult      Choice offered to / List presented to:             Status of service:  In process, will continue to follow Medicare Important Message given?   (If response is "NO", the following Medicare IM given date fields will be blank) Date Medicare IM given:   Medicare IM given by:   Date Additional Medicare IM given:   Additional Medicare IM given by:    Discharge Disposition:    Per UR Regulation:  Reviewed for med. necessity/level of care/duration of stay  If discussed at Long Length of Stay Meetings, dates discussed:    Comments:  09/28/14 Dessa Phi RN BSN NCM [phone] s/p lap nissen fundoplication, takedown hiatal hernia. iv pain control, ivf.No anticipated d/c needs.

## 2014-09-29 ENCOUNTER — Inpatient Hospital Stay (HOSPITAL_COMMUNITY): Payer: Medicare Other

## 2014-09-29 LAB — BASIC METABOLIC PANEL
Anion gap: 7 (ref 5–15)
BUN: 6 mg/dL (ref 6–23)
CO2: 26 mmol/L (ref 19–32)
Calcium: 8.6 mg/dL (ref 8.4–10.5)
Chloride: 106 mmol/L (ref 96–112)
Creatinine, Ser: 0.78 mg/dL (ref 0.50–1.10)
GFR calc Af Amer: >90 mL/min (ref >90)
GFR, EST NON AFRICAN AMERICAN: 86 mL/min — AB (ref >90)
Glucose, Bld: 122 mg/dL — ABNORMAL HIGH (ref 70–99)
POTASSIUM: 3.6 mmol/L (ref 3.5–5.1)
Sodium: 139 mmol/L (ref 135–145)

## 2014-09-29 LAB — CBC
HEMATOCRIT: 37.8 % (ref 36.0–46.0)
Hemoglobin: 12 g/dL (ref 12.0–15.0)
MCH: 26.8 pg (ref 26.0–34.0)
MCHC: 31.7 g/dL (ref 30.0–36.0)
MCV: 84.4 fL (ref 78.0–100.0)
PLATELETS: 151 10*3/uL (ref 150–400)
RBC: 4.48 MIL/uL (ref 3.87–5.11)
RDW: 14.3 % (ref 11.5–15.5)
WBC: 7.7 10*3/uL (ref 4.0–10.5)

## 2014-09-29 NOTE — Progress Notes (Signed)
Patient ID: Katelyn Cuevas, female   DOB: 1951-01-20, 64 y.o.   MRN: [id number] [facility] Surgery Progress Note:   2 Days Post-Op  Subjective: Mental status is clear.  Not clear cause of fever except atelectasis.   Objective: Vital signs in last 24 hours: Temp:  [98.5 F (36.9 C)-101.3 F (38.5 C)] 99.9 F (37.7 C) (02/26 1001) Pulse Rate:  [88-99] 91 (02/26 1001) Resp:  [14-16] 16 (02/26 1001) BP: (128-138)/(68-79) 128/68 mmHg (02/26 1001) SpO2:  [95 %-100 %] 99 % (02/26 1001)  Intake/Output from previous day: 02/25 0701 - 02/26 0700 In: 2940 [P.O.:540; I.V.:2400] Out: 2650 [Urine:2650] Intake/Output this shift:    Physical Exam: Work of breathing is normal.  Not much abdominal pain.    Lab Results:  Results for orders placed or performed during the hospital encounter of 09/27/14 (from the past 48 hour(s))  CBC     Status: None   Collection Time: 09/27/14  5:30 PM  Result Value Ref Range   WBC 9.7 4.0 - 10.5 K/uL   RBC 4.54 3.87 - 5.11 MIL/uL   Hemoglobin 12.2 12.0 - 15.0 g/dL   HCT 38.2 36.0 - 46.0 %   MCV 84.1 78.0 - 100.0 fL   MCH 26.9 26.0 - 34.0 pg   MCHC 31.9 30.0 - 36.0 g/dL   RDW 14.0 11.5 - 15.5 %   Platelets 162 150 - 400 K/uL  Creatinine, serum     Status: Abnormal   Collection Time: 09/27/14  5:30 PM  Result Value Ref Range   Creatinine, Ser 0.99 0.50 - 1.10 mg/dL   GFR calc non Af Amer 59 (L) >90 mL/min   GFR calc Af Amer 68 (L) >90 mL/min    Comment: (NOTE) The eGFR has been calculated using the CKD EPI equation. This calculation has not been validated in all clinical situations. eGFR's persistently <90 mL/min signify possible Chronic Kidney Disease.   CBC     Status: Abnormal   Collection Time: 09/28/14  5:21 AM  Result Value Ref Range   WBC 9.1 4.0 - 10.5 K/uL   RBC 4.35 3.87 - 5.11 MIL/uL   Hemoglobin 11.8 (L) 12.0 - 15.0 g/dL   HCT 36.7 36.0 - 46.0 %   MCV 84.4 78.0 - 100.0 fL   MCH 27.1 26.0 - 34.0 pg   MCHC 32.2 30.0 - 36.0  g/dL   RDW 14.1 11.5 - 15.5 %   Platelets 186 150 - 400 K/uL  Basic metabolic panel     Status: Abnormal   Collection Time: 09/28/14  5:21 AM  Result Value Ref Range   Sodium 139 135 - 145 mmol/L   Potassium 4.0 3.5 - 5.1 mmol/L   Chloride 106 96 - 112 mmol/L   CO2 26 19 - 32 mmol/L   Glucose, Bld 132 (H) 70 - 99 mg/dL   BUN 10 6 - 23 mg/dL   Creatinine, Ser 0.82 0.50 - 1.10 mg/dL   Calcium 8.8 8.4 - 10.5 mg/dL   GFR calc non Af Amer 73 (L) >90 mL/min   GFR calc Af Amer 85 (L) >90 mL/min    Comment: (NOTE) The eGFR has been calculated using the CKD EPI equation. This calculation has not been validated in all clinical situations. eGFR's persistently <90 mL/min signify possible Chronic Kidney Disease.    Anion gap 7 5 - 15  CBC     Status: None   Collection Time: 09/29/14  5:45 AM  Result Value Ref Range  WBC 7.7 4.0 - 10.5 K/uL   RBC 4.48 3.87 - 5.11 MIL/uL   Hemoglobin 12.0 12.0 - 15.0 g/dL   HCT 37.8 36.0 - 46.0 %   MCV 84.4 78.0 - 100.0 fL   MCH 26.8 26.0 - 34.0 pg   MCHC 31.7 30.0 - 36.0 g/dL   RDW 14.3 11.5 - 15.5 %   Platelets 151 150 - 400 K/uL  Basic metabolic panel     Status: Abnormal   Collection Time: 09/29/14  5:45 AM  Result Value Ref Range   Sodium 139 135 - 145 mmol/L   Potassium 3.6 3.5 - 5.1 mmol/L   Chloride 106 96 - 112 mmol/L   CO2 26 19 - 32 mmol/L   Glucose, Bld 122 (H) 70 - 99 mg/dL   BUN 6 6 - 23 mg/dL   Creatinine, Ser 0.78 0.50 - 1.10 mg/dL   Calcium 8.6 8.4 - 10.5 mg/dL   GFR calc non Af Amer 86 (L) >90 mL/min   GFR calc Af Amer >90 >90 mL/min    Comment: (NOTE) The eGFR has been calculated using the CKD EPI equation. This calculation has not been validated in all clinical situations. eGFR's persistently <90 mL/min signify possible Chronic Kidney Disease.    Anion gap 7 5 - 15    Radiology/Results: Dg Ugi W/water Sol Cm  09/28/2014   CLINICAL DATA:  Patient status post Nissen fundoplication for hiatal hernia.  EXAM: WATER SOLUBLE  UPPER GI SERIES  TECHNIQUE: Single-column upper GI series was performed using water soluble contrast.  CONTRAST:  62m OMNIPAQUE IOHEXOL 300 MG/ML  SOLN  COMPARISON:  Upper GI 06/15/2014  FLUOROSCOPY TIME:  Radiation Exposure Index (as provided by the fluoroscopic device): N/A  If the device does not provide the exposure index:  Fluoroscopy Time (in minutes and seconds):  1 minutes 1 second  Number of Acquired Images:  9  FINDINGS: Patient was administered water-soluble contrast by mouth. Contrast flows readily through the GE junction which is below the diaphragm. The entirety of stomach is below the diaphragm. Indentation along the distal esophagus is noted at the fundoplication wrap. There is no evidence of leak or obstruction. Contrast flows into the second portion the duodenum. There is some residual contrast within the esophagus.  IMPRESSION: 1. No leak or obstruction following Nissen fundoplication. 2. Entirety of stomach is below the diaphragm as well as the GE junction. 3. Mild residual within the esophagus.   Electronically Signed   By: SSuzy BouchardM.D.   On: 09/28/2014 10:35    Anti-infectives: Anti-infectives    Start     Dose/Rate Route Frequency Ordered Stop   09/27/14 1800  cefOXitin (MEFOXIN) 1 g in dextrose 5 % 50 mL IVPB     1 g 100 mL/hr over 30 Minutes Intravenous 4 times per day 09/27/14 1610 09/27/14 1732   09/27/14 0940  cefOXitin (MEFOXIN) 2 g in dextrose 5 % 50 mL IVPB     2 g 100 mL/hr over 30 Minutes Intravenous On call to O.R. 09/27/14 0940 09/27/14 1428      Assessment/Plan: Problem List: Patient Active Problem List   Diagnosis Date Noted  . Status post laparoscopic Nissen fundoplication and repair of large type III hernia Feb 2016 09/27/2014  . Tubular adenoma of colon 05/02/2013  . VITAMIN D DEFICIENCY 12/09/2007  . DEPRESSIVE DISORDER NOT ELSEWHERE CLASSIFIED 12/09/2007  . PAIN IN JOINT, SITE UNSPECIFIED 12/03/2007  . UNSPECIFIED MYALGIA AND MYOSITIS  12/03/2007  . EDEMA  12/03/2007    WBC not elevated with temp to 101.  Discussed staying another day vs going home today.  She would like to go home.   Will check chest xray and repeat lab in am if she is still here.  Will check back to see how she is doing today.   2 Days Post-Op    LOS: 2 days   Matt B. Hassell Done, MD, [facility] [phone] beeper [phone]  09/29/2014 11:09 AM

## 2014-09-30 LAB — CBC WITH DIFFERENTIAL/PLATELET
Basophils Absolute: 0 10*3/uL (ref 0.0–0.1)
Basophils Relative: 1 % (ref 0–1)
EOS ABS: 0.2 10*3/uL (ref 0.0–0.7)
Eosinophils Relative: 2 % (ref 0–5)
HCT: 36.2 % (ref 36.0–46.0)
HEMOGLOBIN: 11.5 g/dL — AB (ref 12.0–15.0)
LYMPHS ABS: 2.2 10*3/uL (ref 0.7–4.0)
LYMPHS PCT: 35 % (ref 12–46)
MCH: 27 pg (ref 26.0–34.0)
MCHC: 31.8 g/dL (ref 30.0–36.0)
MCV: 85 fL (ref 78.0–100.0)
MONO ABS: 0.5 10*3/uL (ref 0.1–1.0)
Monocytes Relative: 8 % (ref 3–12)
NEUTROS PCT: 54 % (ref 43–77)
Neutro Abs: 3.4 10*3/uL (ref 1.7–7.7)
PLATELETS: 167 10*3/uL (ref 150–400)
RBC: 4.26 MIL/uL (ref 3.87–5.11)
RDW: 14.3 % (ref 11.5–15.5)
WBC: 6.3 10*3/uL (ref 4.0–10.5)

## 2014-09-30 MED ORDER — OXYCODONE HCL 5 MG/5ML PO SOLN: 5.0000 mg | ORAL | Status: DC | PRN

## 2014-09-30 NOTE — Discharge Instructions (Signed)
Dysphagia Level 1 Diet, Pureed The dysphasia level 1 diet includes foods that are completely pureed and smooth. The foods have a pudding-like texture, such as the texture of pureed pancakes, mashed potatoes, and yogurt. The diet does not include foods with lumps or coarse textures. Liquids should be smooth and may either be thin, nectar-thick, honey-like, or spoon-thick. This diet is helpful for people with moderate to severe swallowing problems. It reduces the risk of food getting caught in the windpipe, trachea, or lungs. You may need help or supervision during meals while following this diet. WHAT DO I NEED TO KNOW ABOUT THIS DIET? Foods  You may eat foods that are soft and have a pudding-like texture. If a food does not have this texture, you may be able to eat the food after:  Pureeing it. This can be done with a blender or whisk.  Moistening it with liquid. For example, you may have bread if you soak it in milk or syrup.  Avoid foods that are hard, dry, sticky, chunky, lumpy, or stringy. Also avoid foods with nuts, seeds, raisins, skins, and pulp.  Do not eat foods that you have to chew. If you have to chew the food, then you cannot eat it.  Eat a variety of foods to get all the nutrients you need. Liquids  You may drink liquids that are smooth. Your health care provider will tell you if you should drink thin or thickened liquids.  To thicken a liquid, use a food and beverage thickener or a thickening food. Thickened liquids are usually a "pudding-like" consistency.  Thin liquids include fruit juices, milk, coffee, tea, yogurts, shakes, and similar foods that melt to thin liquid at room temperature.  Avoid liquids with seeds, pulp, or chunks. See your dietitian or health care provider regularly for help with your dietary changes. WHAT FOODS CAN I EAT? Grains Store-bought soft breads, pancakes, and [place] toast that have a smooth, moist texture and do not have nuts or seeds (you  will need to moisten the food with liquid). Cooked cereals that have a pudding-like consistency, such as cream of wheat or farina (no oatmeal). Pureed, well-cooked pasta, rice, and plain bread stuffing. Vegetables Pureed vegetables. Soft avocado. Smooth tomato paste or sauce. Strained or pureed soups (these may need to be thickened as directed). Mashed or pureed potatoes without skin (can be seasoned with butter, smooth gravy, margarine, or sour cream). Fruits Pureed fruits such as melons and apples without seeds or pulp. Mashed bananas. Smooth tomato paste or sauce. Fruit juices without pulp or seeds. Strained or pureed soups. Meat and Other Protein Sources Pureed meat. Smooth pate or liverwurst. Smooth souffles. Pureed beans (such as lentils). Pureed eggs. Dairy Yogurt. Smooth cheese sauces. Milk (may need to be thickened). Nutritional dairy drinks or shakes. Ask your health care provider whether you can have ice cream. Condiments Finely ground salt, pepper, and other ground spices. Sweets/Desserts Smooth puddings and custards. Pureed desserts. Souffles. Whipped topping. Ask your health care provider whether you can have frozen desserts. Fats and Oils Butter. Margarine. Smooth and strained gravy. Sour cream. Mayonnaise. Cream cheese. Whipped topping. Smooth sauces (such as white sauce, cheese sauce, or hollandaise sauce). The items listed above may not be a complete list of recommended foods or beverages. Contact your dietitian for more options. WHAT FOODS ARE NOT RECOMMENDED? Grains Oatmeal. Dry cereals. Hard breads. Vegetables Whole vegetables. Stringy vegetables (such as celery). Thin tomato sauce. Fruits Whole fresh, frozen, canned, or dried fruits that have  not been pureed. Stringy fruits (such as pineapple). Meat and Other Protein Sources Whole or ground meat, fish, or poultry. Dried or cooked lentils or legumes that have been cooked but not mashed or pureed. Non-pureed eggs. Nuts and  seeds. Peanut butter. Dairy Non-pureed cheese. Dairy products with lumps or chunks. Ask your health care provider whether you can have ice cream. Condiments Coarse or seeded herbs and spices. Sweets/Desserts [place] preserves. Jams with seeds. Solid desserts. Sticky, chewy sweets (such as licorice and caramel). Ask your health care provider whether you can have frozen desserts. Fats and Oils Sauces of fats with lumps or chunks. The items listed above may not be a complete list of foods and beverages to avoid. Contact your dietitian for more information. Document Released: 07/21/2005 Document Revised: 12/05/2013 Document Reviewed: 07/04/2013 [facility] Patient Information 2015 [place], [place]. This information is not intended to replace advice given to you by your health care provider. Make sure you discuss any questions you have with your health care provider.

## 2014-09-30 NOTE — Discharge Summary (Signed)
Physician Discharge Summary  Patient ID: EMMALIE HAIGH MRN: [id number] DOB/AGE: 09/09/1950 64 y.o.  Admit date: 09/27/2014 Discharge date: 09/30/2014  Admission Diagnoses:  Large type III mixed hiatus hernia  Discharge Diagnoses:  same  Active Problems:   Status post laparoscopic Nissen fundoplication and repair of large type III hernia Feb 2016   Surgery:  Laparoscopic repair of hiatus hernia and Nissen fundoplication over a 6 dilator  Discharged Condition: improved  Hospital Course:   Had surgery.  Swallow on PD 1 looked good.  Begun on clear liquids.  Had temp on PD 2 with normal chest xray and normal WBC.  Feeling better and afebrile on PD 3 and ready for discharge.    Consults: none  Significant Diagnostic Studies: UGI looked good    Discharge Exam: Blood pressure 109/69, pulse 79, temperature 98 F (36.7 C), temperature source Oral, resp. rate 17, height 5' 6.5" (1.689 m), weight 156 lb 12.8 oz (71.124 kg), SpO2 96 %. Incisions OK.  Slight bruising on the lateral right   Disposition: 01-Home or Self Care  Discharge Instructions    Diet - low sodium heart healthy    Complete by:  As directed      Discharge instructions    Complete by:  As directed   Full liquid diet until this Wednesday and then advance to pureed diet for about 4 weeks.     Increase activity slowly    Complete by:  As directed      No wound care    Complete by:  As directed             Medication List    TAKE these medications        buPROPion 300 MG 24 hr tablet  Commonly known as:  WELLBUTRIN XL  Take 300 mg by mouth daily.     oxyCODONE 5 MG/5ML solution  Commonly known as:  ROXICODONE  Take 5-10 mLs (5-10 mg total) by mouth every 4 (four) hours as needed for severe pain.     phenazopyridine 200 MG tablet  Commonly known as:  PYRIDIUM  Take 1 tablet (200 mg total) by mouth 3 (three) times daily.     sulfamethoxazole-trimethoprim 800-160 MG per tablet  Commonly known as:   BACTRIM DS,SEPTRA DS  Take 1 tablet by mouth 2 (two) times daily.           Follow-up Information    Follow up with Johnathan Hausen B, MD. Schedule an appointment as soon as possible for a visit in 3 weeks.   Specialty:  General Surgery   Contact information:   [address] [place] [address] [phone]       Signed: Pedro Earls 09/30/2014, 10:41 AM

## 2014-10-02 ENCOUNTER — Encounter: Payer: Self-pay | Admitting: Family Medicine

## 2014-12-18 ENCOUNTER — Telehealth: Payer: Self-pay

## 2014-12-18 NOTE — Telephone Encounter (Signed)
Called patient, stated that she had one done not too long ago, Please get results for this from Dr. Baltazar Apo office.

## 2014-12-19 NOTE — Telephone Encounter (Signed)
When I google search it. [facility] pops up although it won't let me go to the website because it is saying that the Web Page cant be displayed.

## 2014-12-19 NOTE — Telephone Encounter (Signed)
Do you know where Dr. Mikey Kirschner is?

## 2014-12-20 NOTE — Telephone Encounter (Signed)
Done

## 2015-01-02 ENCOUNTER — Encounter: Payer: Self-pay | Admitting: Family Medicine

## 2015-04-12 ENCOUNTER — Encounter: Payer: Self-pay | Admitting: Family Medicine

## 2015-07-05 ENCOUNTER — Encounter: Payer: Self-pay | Admitting: Family Medicine

## 2015-07-05 ENCOUNTER — Ambulatory Visit (INDEPENDENT_AMBULATORY_CARE_PROVIDER_SITE_OTHER): Payer: Medicare Other | Admitting: Family Medicine

## 2015-07-05 VITALS — BP 120/56 | HR 89 | Temp 98.1°F | Resp 16 | Ht 65.75 in | Wt 161.0 lb

## 2015-07-05 DIAGNOSIS — Z0000 Encounter for general adult medical examination without abnormal findings: Secondary | ICD-10-CM | POA: Insufficient documentation

## 2015-07-05 NOTE — Progress Notes (Signed)
Office Note 07/05/2015  CC:  Chief Complaint  Patient presents with  . Annual Exam    Pt is not fasting.     HPI:  Katelyn Cuevas is a 64 y.o. White female who is here for annual health maintenance exam today. No acute complaints. Had eye exam earlier today: no abnormalities found. UTD on breast and cerv ca screening as well as DEXA and colon ca screening. No formal exercise but "stays active" with grandchildren.   Past Medical History  Diagnosis Date  . Osteoarthritis     Multiple joints; left THR, right TKA  . Anxiety and depression     pt denies anxiety   . History of vitamin D deficiency     pt denies  . History of adenomatous polyp of colon   . Paraesophageal hiatal hernia     Surgically repaired 09/2014  . Depression   . GERD (gastroesophageal reflux disease)   . History of hiatal hernia   . Urinary tract infection     hx of   . Osteoporosis 09/2014 DEXA    Worsening osteoporosis R hip.  Pt declined trial of bisphosphonate from GYN.  Repeat DEXA planned 09/2016 by GYN as per their 12/2014 office note.    Past Surgical History  Procedure Laterality Date  . Total hip arthroplasty  2008    left (Dr. Noemi Chapel)  . Vaginal hysterectomy  1991    fibroids (both ovaries still in)  . Colonoscopy w/ polypectomy  Multiple.  Most recent 04/15/13.     2 adenomas; repeat 5 yrs (Dr. Cristina Gong)  . Total knee arthroplasty  11/2013    Right.  [facility]: Dr. Al Corpus  . Hiatal hernia repair N/A 09/27/2014    Procedure: Laparoscopic takedown of large type III hiatus hernia and Nissen fundoplication over 0000000 dilator;  Surgeon: Pedro Earls, MD;  Location: [facility];  Service: General;  Laterality: N/A;    No family history on file.  Social History   Social History  . Marital Status: Married    Spouse Name: N/A  . Number of Children: N/A  . Years of Education: N/A   Occupational History  . Not on file.   Social History Main Topics  . Smoking status: Never Smoker   .  Smokeless tobacco: Never Used  . Alcohol Use: No  . Drug Use: No  . Sexual Activity: Not on file   Other Topics Concern  . Not on file   Social History Narrative    Married, 2 children.   Occupation: real estate agent   No T/A/Ds.         MEDS: pt taking only wellbutrin listed below Outpatient Encounter Prescriptions as of 07/05/2015  Medication Sig  . buPROPion (WELLBUTRIN XL) 300 MG 24 hr tablet Take 300 mg by mouth daily.  . [DISCONTINUED] oxyCODONE (ROXICODONE) 5 MG/5ML solution Take 5-10 mLs (5-10 mg total) by mouth every 4 (four) hours as needed for severe pain. (Patient not taking: Reported on 07/05/2015)  . [DISCONTINUED] phenazopyridine (PYRIDIUM) 200 MG tablet Take 1 tablet (200 mg total) by mouth 3 (three) times daily. (Patient not taking: Reported on 09/27/2014)  . [DISCONTINUED] sulfamethoxazole-trimethoprim (BACTRIM DS,SEPTRA DS) 800-160 MG per tablet Take 1 tablet by mouth 2 (two) times daily. (Patient not taking: Reported on 09/27/2014)   No facility-administered encounter medications on file as of 07/05/2015.    No Known Allergies  ROS Review of Systems  PE; Blood pressure 120/56, pulse 89, temperature 98.1 F (36.7 C), temperature  source Oral, resp. rate 16, height 5' 5.75" (1.67 m), weight 161 lb (73.029 kg), SpO2 96 %. Gen: Alert, well appearing.  Patient is oriented to person, place, time, and situation. AFFECT: pleasant, lucid thought and speech. ENT: Ears: EACs clear, normal epithelium.  TMs with good light reflex and landmarks bilaterally.  Eyes: no injection, icteris, swelling, or exudate.  EOMI, PERRLA. Nose: no drainage or turbinate edema/swelling.  No injection or focal lesion.  Mouth: lips without lesion/swelling.  Oral mucosa pink and moist.  Dentition intact and without obvious caries or gingival swelling.  Oropharynx without erythema, exudate, or swelling.  Neck: supple/nontender.  No LAD, mass, or TM.  Carotid pulses 2+ bilaterally, without  bruits. CV: RRR, no m/r/g.   LUNGS: CTA bilat, nonlabored resps, good aeration in all lung fields. ABD: soft, NT, ND, BS normal.  No hepatospenomegaly or mass.  No bruits. EXT: no clubbing, cyanosis, or edema.  Musculoskeletal: no joint swelling, erythema, warmth, or tenderness.  ROM of all joints intact. Skin - no sores or suspicious lesions or rashes or color changes  Pertinent labs:  Lab Results  Component Value Date   TSH 1.14 12/03/2007   Lab Results  Component Value Date   WBC 6.3 09/30/2014   HGB 11.5* 09/30/2014   HCT 36.2 09/30/2014   MCV 85.0 09/30/2014   PLT 167 09/30/2014   Lab Results  Component Value Date   CREATININE 0.78 09/29/2014   BUN 6 09/29/2014   NA 139 09/29/2014   K 3.6 09/29/2014   CL 106 09/29/2014   CO2 26 09/29/2014   Lab Results  Component Value Date   ALT 27 08/26/2007   AST 26 08/26/2007   ALKPHOS 120* 08/26/2007   BILITOT 0.7 08/26/2007   ASSESSMENT AND PLAN:   Health maintenance exam: Reviewed age and gender appropriate health maintenance issues (prudent diet, regular exercise, health risks of tobacco and excessive alcohol, use of seatbelts, fire alarms in home, use of sunscreen).  Also reviewed age and gender appropriate health screening as well as vaccine recommendations. UTD on vaccines except pneumovax 23, which she cannot get until after 05/29/16 due to pharmacy recently giving her a 2nd prevnar 13 vaccine. She'll return for fasting health panel.  An After Visit Summary was printed and given to the patient.  Return in about 1 year (around 07/04/2016) for annual CPE (fasting).  Pt needs lab appt at her earliest convenience for fasting blood work (ordered.

## 2015-07-05 NOTE — Progress Notes (Signed)
Pre visit review using our clinic review tool, if applicable. No additional management support is needed unless otherwise documented below in the visit note. 

## 2015-07-11 ENCOUNTER — Other Ambulatory Visit (INDEPENDENT_AMBULATORY_CARE_PROVIDER_SITE_OTHER): Payer: Medicare Other

## 2015-07-11 DIAGNOSIS — Z0000 Encounter for general adult medical examination without abnormal findings: Secondary | ICD-10-CM

## 2015-07-11 DIAGNOSIS — F329 Major depressive disorder, single episode, unspecified: Secondary | ICD-10-CM

## 2015-07-11 DIAGNOSIS — M810 Age-related osteoporosis without current pathological fracture: Secondary | ICD-10-CM | POA: Diagnosis not present

## 2015-07-11 LAB — CBC WITH DIFFERENTIAL/PLATELET
BASOS ABS: 0 10*3/uL (ref 0.0–0.1)
Basophils Relative: 0.7 % (ref 0.0–3.0)
Eosinophils Absolute: 0.2 10*3/uL (ref 0.0–0.7)
Eosinophils Relative: 2.6 % (ref 0.0–5.0)
HEMATOCRIT: 37.9 % (ref 36.0–46.0)
HEMOGLOBIN: 12.3 g/dL (ref 12.0–15.0)
LYMPHS PCT: 35.9 % (ref 12.0–46.0)
Lymphs Abs: 2.2 10*3/uL (ref 0.7–4.0)
MCHC: 32.4 g/dL (ref 30.0–36.0)
MCV: 82.1 fl (ref 78.0–100.0)
Monocytes Absolute: 0.4 10*3/uL (ref 0.1–1.0)
Monocytes Relative: 6.7 % (ref 3.0–12.0)
NEUTROS ABS: 3.3 10*3/uL (ref 1.4–7.7)
Neutrophils Relative %: 54.1 % (ref 43.0–77.0)
PLATELETS: 204 10*3/uL (ref 150.0–400.0)
RBC: 4.62 Mil/uL (ref 3.87–5.11)
RDW: 15 % (ref 11.5–15.5)
WBC: 6.1 10*3/uL (ref 4.0–10.5)

## 2015-07-11 LAB — COMPREHENSIVE METABOLIC PANEL
ALBUMIN: 3.9 g/dL (ref 3.5–5.2)
ALT: 15 U/L (ref 0–35)
AST: 18 U/L (ref 0–37)
Alkaline Phosphatase: 88 U/L (ref 39–117)
BUN: 20 mg/dL (ref 6–23)
CHLORIDE: 108 meq/L (ref 96–112)
CO2: 29 mEq/L (ref 19–32)
CREATININE: 0.89 mg/dL (ref 0.40–1.20)
Calcium: 8.9 mg/dL (ref 8.4–10.5)
GFR: 67.35 mL/min (ref >60.00)
GLUCOSE: 87 mg/dL (ref 70–99)
Potassium: 4 mEq/L (ref 3.5–5.1)
Sodium: 144 mEq/L (ref 135–145)
Total Bilirubin: 0.5 mg/dL (ref 0.2–1.2)
Total Protein: 6.1 g/dL (ref 6.0–8.3)

## 2015-07-11 LAB — LIPID PANEL
CHOL/HDL RATIO: 3
CHOLESTEROL: 207 mg/dL — AB (ref 0–200)
HDL: 60.2 mg/dL (ref >39.00)
LDL CALC: 126 mg/dL — AB (ref 0–99)
NonHDL: 146.99
Triglycerides: 103 mg/dL (ref 0.0–149.0)
VLDL: 20.6 mg/dL (ref 0.0–40.0)

## 2015-07-11 LAB — TSH: TSH: 2.43 u[IU]/mL (ref 0.35–4.50)

## 2016-02-08 ENCOUNTER — Telehealth: Payer: Self-pay

## 2016-02-08 NOTE — Telephone Encounter (Signed)
Patient is on the list for [employer] 2017 and may be a good candidate for an AWV in 2017. Please let me know if/when appt is scheduled.   

## 2016-02-14 NOTE — Telephone Encounter (Signed)
Diane please set up this patient with an AWV and confirm with Stefannie once scheduled. Thank you!

## 2016-04-01 ENCOUNTER — Encounter: Payer: Self-pay | Admitting: Family Medicine

## 2016-04-01 ENCOUNTER — Ambulatory Visit (INDEPENDENT_AMBULATORY_CARE_PROVIDER_SITE_OTHER): Payer: Medicare Other | Admitting: Family Medicine

## 2016-04-01 VITALS — BP 122/78 | HR 85 | Temp 99.0°F | Resp 18 | Wt 164.5 lb

## 2016-04-01 DIAGNOSIS — M542 Cervicalgia: Secondary | ICD-10-CM

## 2016-04-01 MED ORDER — CYCLOBENZAPRINE HCL 5 MG PO TABS: 5.0000 mg | ORAL_TABLET | Freq: Three times a day (TID) | ORAL | 0 refills | Status: DC | PRN

## 2016-04-01 MED ORDER — NAPROXEN 500 MG PO TABS: 500.0000 mg | ORAL_TABLET | Freq: Two times a day (BID) | ORAL | 0 refills | Status: DC

## 2016-04-01 NOTE — Progress Notes (Signed)
Katelyn Cuevas , [birth year], 65 y.o., female MRN: [id number] Patient Care Team    Relationship Specialty Notifications Start End  Katelyn Sou, MD PCP - General Family Medicine  06/13/14    Comment: Dr. Linna Darner retirement  Katelyn Lobo, MD Consulting Physician Gastroenterology  06/13/14   Katelyn Hausen, MD Consulting Physician General Surgery  10/02/14     CC: left shoulder/neck pain Subjective: Pt presents for an acute OV with complaints of shoulder/neck pain on the left side of 3 days duration.  She has had this a couple times in the past few months, and treated with NSAIDS and compresses. She states sometimes she can associate it with lifting, sleeping on a different pillow  but no this time. Associated symptoms include burning/sharp pain in upper trap and neck. She has tried heat/cold compresses and tylenol without improvement, any movement makes worse. She has arthritis in her hips and knees with replacements. No prior injury or trauma.    No Known Allergies Social History  Substance Use Topics  . Smoking status: Never Smoker  . Smokeless tobacco: Never Used  . Alcohol use No   Past Medical History:  Diagnosis Date  . Anxiety and depression    pt denies anxiety   . Depression   . GERD (gastroesophageal reflux disease)   . History of adenomatous polyp of colon   . History of hiatal hernia   . History of vitamin D deficiency    pt denies  . Osteoarthritis    Multiple joints; left THR, right TKA  . Osteoporosis 09/2014 DEXA   Worsening osteoporosis R hip.  Pt declined trial of bisphosphonate from GYN.  Repeat DEXA planned 09/2016 by GYN as per their 12/2014 office note.  . Paraesophageal hiatal hernia    Surgically repaired 09/2014  . Urinary tract infection    hx of    Past Surgical History:  Procedure Laterality Date  . COLONOSCOPY W/ POLYPECTOMY  Multiple.  Most recent 04/15/13.    2 adenomas; repeat 5 yrs (Dr. Cristina Cuevas)  . HIATAL HERNIA REPAIR N/A 09/27/2014     Procedure: Laparoscopic takedown of large type III hiatus hernia and Nissen fundoplication over 0000000 dilator;  Surgeon: Katelyn Earls, MD;  Location: [facility];  Service: General;  Laterality: N/A;  . TOTAL HIP ARTHROPLASTY  2008   left (Dr. Noemi Cuevas)  . TOTAL KNEE ARTHROPLASTY  11/2013   Right.  [facility]: Dr. Al Cuevas  . VAGINAL HYSTERECTOMY  1991   fibroids (both ovaries still in)   History reviewed. No pertinent family history.   Medication List       Accurate as of 04/01/16 10:31 AM. Always use your most recent med list.          buPROPion 300 MG 24 hr tablet Commonly known as:  WELLBUTRIN XL Take 300 mg by mouth daily.       No results found for this or any previous visit (from the past 24 hour(s)). No results found.   ROS: Negative, with the exception of above mentioned in HPI   Objective:  BP 122/78 (BP Location: Right Arm, Patient Position: Sitting, Cuff Size: Normal)   Pulse 85   Temp 99 F (37.2 C)   Resp 18   Wt 164 lb 8 oz (74.6 kg)   SpO2 96%   BMI 26.75 kg/m  Body mass index is 26.75 kg/m. Gen: Afebrile. No acute distress. Nontoxic in appearance, well developed, well nourished. Very pleasant caucasian female.  HENT: AT. [place].  MMM Eyes:Pupils Equal Round Reactive to light, Extraocular movements intact,  Conjunctiva without redness, discharge or icterus. Neck/lymp/endocrine: Supple,no lymphadenopathy MSK: No erythema, bruising or bone tenderness cervical spine. Bilateral tissue texture change/spasm upper trapezius. FROM cervical spine. FROM bilateral arms. Neg empty can test, neg hawkins test. NV intact distally.  Skin: no rashes, purpura or petechiae.  Neuro: Normal gait. PERLA. EOMi. Alert. Oriented x3 Cranial   Assessment/Plan: Katelyn Cuevas is a 65 y.o. female present for acute OV for  Neck pain on left side - Naproxen scheduled BID for  5-7 days, flexeril at least nightly (sedation precaution).  - therapeutic massage and heat therapy would be  beneficial.  - suspect this is mechanical or stress related.  - cyclobenzaprine (FLEXERIL) 5 MG tablet; Take 1 tablet (5 mg total) by mouth 3 (three) times daily as needed for muscle spasms.  Dispense: 30 tablet; Refill: 0 - naproxen (NAPROSYN) 500 MG tablet; Take 1 tablet (500 mg total) by mouth 2 (two) times daily with a meal.  Dispense: 30 tablet; Refill: 0 - F/U 2-4 weeks if not improving.   > 25 minutes spent with patient, >50% of time spent face to face counseling patient and coordinating care.    electronically signed by:  Howard Pouch, DO  [place]

## 2016-04-01 NOTE — Patient Instructions (Signed)
naproxen 500 mg every 12 hours for 7 days with a meal.  Flexeril at night, and if needed every 8 hours, no driving.  Massage would be helful Heat therapy as discussed.  Follow up in 4 weeks if not improved.

## 2016-04-14 ENCOUNTER — Telehealth: Payer: Self-pay

## 2016-04-14 NOTE — Telephone Encounter (Signed)
Spoke with patient regarding scheduling [place], declined at this time.

## 2016-05-19 ENCOUNTER — Other Ambulatory Visit: Payer: Self-pay

## 2016-05-19 DIAGNOSIS — M542 Cervicalgia: Secondary | ICD-10-CM

## 2016-05-19 MED ORDER — NAPROXEN 500 MG PO TABS: 500.0000 mg | ORAL_TABLET | Freq: Two times a day (BID) | ORAL | 1 refills | Status: DC

## 2016-05-19 NOTE — Telephone Encounter (Signed)
Pharmacy requesting refill on Naproxen.

## 2016-05-27 NOTE — Telephone Encounter (Signed)
Patient refused appointment in July. Maybe if someone else calls her maybe she will consider

## 2016-07-21 HISTORY — PX: PARS PLANA VITRECTOMY W/ REPAIR OF MACULAR HOLE: SHX2170

## 2016-09-01 ENCOUNTER — Encounter: Payer: Self-pay | Admitting: Family Medicine

## 2016-09-01 ENCOUNTER — Ambulatory Visit (INDEPENDENT_AMBULATORY_CARE_PROVIDER_SITE_OTHER): Payer: Medicare Other | Admitting: Family Medicine

## 2016-09-01 VITALS — BP 144/88 | HR 76 | Temp 98.2°F | Resp 16 | Ht 66.0 in | Wt 167.5 lb

## 2016-09-01 DIAGNOSIS — Z0000 Encounter for general adult medical examination without abnormal findings: Secondary | ICD-10-CM | POA: Diagnosis not present

## 2016-09-01 LAB — CBC WITH DIFFERENTIAL/PLATELET
Basophils Absolute: 0.1 10*3/uL (ref 0.0–0.1)
Basophils Relative: 1 % (ref 0.0–3.0)
EOS PCT: 2.7 % (ref 0.0–5.0)
Eosinophils Absolute: 0.2 10*3/uL (ref 0.0–0.7)
HEMATOCRIT: 38.8 % (ref 36.0–46.0)
HEMOGLOBIN: 13.1 g/dL (ref 12.0–15.0)
LYMPHS PCT: 36.4 % (ref 12.0–46.0)
Lymphs Abs: 2.2 10*3/uL (ref 0.7–4.0)
MCHC: 33.7 g/dL (ref 30.0–36.0)
MCV: 82.1 fl (ref 78.0–100.0)
MONOS PCT: 5.9 % (ref 3.0–12.0)
Monocytes Absolute: 0.4 10*3/uL (ref 0.1–1.0)
Neutro Abs: 3.3 10*3/uL (ref 1.4–7.7)
Neutrophils Relative %: 54 % (ref 43.0–77.0)
Platelets: 213 10*3/uL (ref 150.0–400.0)
RBC: 4.73 Mil/uL (ref 3.87–5.11)
RDW: 14.5 % (ref 11.5–15.5)
WBC: 6.1 10*3/uL (ref 4.0–10.5)

## 2016-09-01 LAB — TSH: TSH: 1.81 u[IU]/mL (ref 0.35–4.50)

## 2016-09-01 LAB — COMPREHENSIVE METABOLIC PANEL
ALK PHOS: 86 U/L (ref 39–117)
ALT: 17 U/L (ref 0–35)
AST: 17 U/L (ref 0–37)
Albumin: 4.3 g/dL (ref 3.5–5.2)
BUN: 13 mg/dL (ref 6–23)
CHLORIDE: 106 meq/L (ref 96–112)
CO2: 30 mEq/L (ref 19–32)
Calcium: 8.9 mg/dL (ref 8.4–10.5)
Creatinine, Ser: 0.89 mg/dL (ref 0.40–1.20)
GFR: 67.12 mL/min (ref >60.00)
GLUCOSE: 96 mg/dL (ref 70–99)
POTASSIUM: 3.6 meq/L (ref 3.5–5.1)
SODIUM: 143 meq/L (ref 135–145)
TOTAL PROTEIN: 6.5 g/dL (ref 6.0–8.3)
Total Bilirubin: 0.6 mg/dL (ref 0.2–1.2)

## 2016-09-01 LAB — LIPID PANEL
Cholesterol: 199 mg/dL (ref 0–200)
HDL: 62.4 mg/dL (ref >39.00)
LDL CALC: 102 mg/dL — AB (ref 0–99)
NONHDL: 136.67
Total CHOL/HDL Ratio: 3
Triglycerides: 174 mg/dL — ABNORMAL HIGH (ref 0.0–149.0)
VLDL: 34.8 mg/dL (ref 0.0–40.0)

## 2016-09-01 NOTE — Progress Notes (Signed)
Pre visit review using our clinic review tool, if applicable. No additional management support is needed unless otherwise documented below in the visit note. 

## 2016-09-01 NOTE — Progress Notes (Signed)
Office Note 09/01/2016  CC:  Chief Complaint  Patient presents with  . Annual Exam    Pt is fasting.   . Medicare Wellness    HPI:  Katelyn Cuevas is a 66 y.o. White female who is here for annual health maintenance exam. Eye exam 07/2016: macular hole detected, got this surgically repaired.   Exercise: watches grandchildren regularly, no formal regimen. Dental: at least annually.  She feels well and is without acute complaint today.  Past Medical History:  Diagnosis Date  . Anxiety and depression    pt denies anxiety   . Depression   . GERD (gastroesophageal reflux disease)   . History of adenomatous polyp of colon   . History of hiatal hernia   . History of vitamin D deficiency    pt denies  . Osteoarthritis    Multiple joints; left THR, right TKA  . Osteoporosis 09/2014 DEXA   Worsening osteoporosis R hip.  Pt declined trial of bisphosphonate from GYN.  Repeat DEXA planned 09/2016 by GYN as per their 12/2014 office note.  . Paraesophageal hiatal hernia    Surgically repaired 09/2014  . Urinary tract infection    hx of     Past Surgical History:  Procedure Laterality Date  . COLONOSCOPY W/ POLYPECTOMY  Multiple.  Most recent 04/15/13.    2 adenomas; repeat 5 yrs (Dr. Cristina Gong)  . HIATAL HERNIA REPAIR N/A 09/27/2014   Procedure: Laparoscopic takedown of large type III hiatus hernia and Nissen fundoplication over 0000000 dilator;  Surgeon: Pedro Earls, MD;  Location: [facility];  Service: General;  Laterality: N/A;  . TOTAL HIP ARTHROPLASTY  2008   left (Dr. Noemi Chapel)  . TOTAL KNEE ARTHROPLASTY  11/2013   Right.  [facility]: Dr. Al Corpus  . VAGINAL HYSTERECTOMY  1991   fibroids (both ovaries still in)    History reviewed. No pertinent family history.  Social History   Social History  . Marital status: Married    Spouse name: N/A  . Number of children: N/A  . Years of education: N/A   Occupational History  . Not on file.   Social History Main Topics  . Smoking  status: Never Smoker  . Smokeless tobacco: Never Used  . Alcohol use No  . Drug use: No  . Sexual activity: Not on file   Other Topics Concern  . Not on file   Social History Narrative    Married, 2 children.   Occupation: real estate agent   No T/A/Ds.          Outpatient Medications Prior to Visit  Medication Sig Dispense Refill  . buPROPion (WELLBUTRIN XL) 300 MG 24 hr tablet Take 300 mg by mouth daily.    . naproxen (NAPROSYN) 500 MG tablet Take 1 tablet (500 mg total) by mouth 2 (two) times daily with a meal. 30 tablet 1  . cyclobenzaprine (FLEXERIL) 5 MG tablet Take 1 tablet (5 mg total) by mouth 3 (three) times daily as needed for muscle spasms. (Patient not taking: Reported on 09/01/2016) 30 tablet 0   No facility-administered medications prior to visit.     No Known Allergies  ROS Review of Systems  Constitutional: Negative for appetite change, chills, fatigue and fever.  HENT: Negative for congestion, dental problem, ear pain and sore throat.   Eyes: Negative for discharge, redness and visual disturbance.  Respiratory: Negative for cough, chest tightness, shortness of breath and wheezing.   Cardiovascular: Positive for leg swelling (mild bilat ankle  swelling at the end of the day some days). Negative for chest pain and palpitations.  Gastrointestinal: Negative for abdominal pain, blood in stool, diarrhea, nausea and vomiting.  Genitourinary: Negative for difficulty urinating, dysuria, flank pain, frequency, hematuria and urgency.  Musculoskeletal: Negative for arthralgias, back pain, joint swelling, myalgias and neck stiffness.  Skin: Negative for pallor and rash.  Neurological: Positive for light-headedness (very brief, mainly with postural changes or rolling over in bed). Negative for dizziness, speech difficulty, weakness and headaches.  Hematological: Negative for adenopathy. Does not bruise/bleed easily.  Psychiatric/Behavioral: Negative for confusion and sleep  disturbance. The patient is not nervous/anxious.     PE; Blood pressure (!) 144/88, pulse 76, temperature 98.2 F (36.8 C), temperature source Oral, resp. rate 16, height 5\' 6"  (1.676 m), weight 167 lb 8 oz (76 kg), SpO2 98 %. Gen: Alert, well appearing.  Patient is oriented to person, place, time, and situation. AFFECT: pleasant, lucid thought and speech. ENT: Ears: EACs clear, normal epithelium.  TMs with good light reflex and landmarks bilaterally.  Eyes: no injection, icteris, swelling, or exudate.  EOMI, PERRLA. Nose: no drainage or turbinate edema/swelling.  No injection or focal lesion.  Mouth: lips without lesion/swelling.  Oral mucosa pink and moist.  Dentition intact and without obvious caries or gingival swelling.  Oropharynx without erythema, exudate, or swelling.  Neck: supple/nontender.  No LAD, mass, or TM.  Carotid pulses 2+ bilaterally, without bruits. CV: RRR, no m/r/g.   LUNGS: CTA bilat, nonlabored resps, good aeration in all lung fields. ABD: soft, NT, ND, BS normal.  No hepatospenomegaly or mass.  No bruits. EXT: no clubbing, cyanosis, or edema.  Musculoskeletal: no joint swelling, erythema, warmth, or tenderness.  ROM of all joints intact. Skin - no sores or suspicious lesions or rashes or color changes  Pertinent labs:  Lab Results  Component Value Date   TSH 2.43 07/11/2015   Lab Results  Component Value Date   WBC 6.1 07/11/2015   HGB 12.3 07/11/2015   HCT 37.9 07/11/2015   MCV 82.1 07/11/2015   PLT 204.0 07/11/2015   Lab Results  Component Value Date   CREATININE 0.89 07/11/2015   BUN 20 07/11/2015   NA 144 07/11/2015   K 4.0 07/11/2015   CL 108 07/11/2015   CO2 29 07/11/2015   Lab Results  Component Value Date   ALT 15 07/11/2015   AST 18 07/11/2015   ALKPHOS 88 07/11/2015   BILITOT 0.5 07/11/2015   Lab Results  Component Value Date   CHOL 207 (H) 07/11/2015   Lab Results  Component Value Date   HDL 60.20 07/11/2015   Lab Results   Component Value Date   LDLCALC 126 (H) 07/11/2015   Lab Results  Component Value Date   TRIG 103.0 07/11/2015   Lab Results  Component Value Date   CHOLHDL 3 07/11/2015    ASSESSMENT AND PLAN:   Health maintenance exam: Reviewed age and gender appropriate health maintenance issues (prudent diet, regular exercise, health risks of tobacco and excessive alcohol, use of seatbelts, fire alarms in home, use of sunscreen).  Also reviewed age and gender appropriate health screening as well as vaccine recommendations. Vaccines all UTD. Colon cancer screening: next colonoscopy 04/2018 (Dr. Cristina Gong). Not a cerv ca screening candidate due to having a hysterectomy for benign reason. Breast ca screening: Mammogram UTD.  An After Visit Summary was printed and given to the patient.  FOLLOW UP:  Return in about 1 year (around 09/01/2017)  for annual CPE (fasting).  Signed:  Crissie Sickles, MD           09/01/2016

## 2016-09-01 NOTE — Progress Notes (Signed)
Subjective:   Katelyn Cuevas is a 66 y.o. female who presents for Medicare Annual (Subsequent) preventive examination.  Review of Systems:  No ROS.  Medicare Wellness Visit.  Cardiac Risk Factors include: advanced age (>94men, >85 women);family history of premature cardiovascular disease   Sleep patterns: Sleeps about 7 hours, up to void x 1.  Home Safety/Smoke Alarms: Smoke detectors and security in place.  Living environment; residence and Firearm Safety: Lives with husband in 1 story with basement. Firearms locked away. Children and grandchildren live close.  Seat Belt Safety/Bike Helmet: Wears seat belt.   Counseling:   Eye Exam-Last exam 07/2016, yearly by [facility]. 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    Dental-Last exam 01/2016, every 6 months.   Female:   Pap-09/05/2014, [facility].        Mammo-09/06/2014, negative. Will call for appointment.      Dexa scan-09/06/2014, osteoporosis. Will call for appointment [facility]-colonoscopy 04/15/2013, polyp. Recall 10 years.       Objective:     Vitals: BP (!) 144/88 (BP Location: Left Arm, Patient Position: Sitting, Cuff Size: Normal)   Pulse 76   Temp 98.2 F (36.8 C) (Oral)   Resp 16   Ht 5\' 6"  (1.676 m)   Wt 167 lb 8 oz (76 kg)   SpO2 98%   BMI 27.04 kg/m   Body mass index is 27.04 kg/m.   Tobacco History  Smoking Status  . Never Smoker  Smokeless Tobacco  . Never Used     Counseling given: No   Past Medical History:  Diagnosis Date  . Depression   . GERD (gastroesophageal reflux disease)   . History of adenomatous polyp of colon   . History of hiatal hernia   . History of vitamin D deficiency    pt denies  . Osteoarthritis    Multiple joints; left THR, right TKA  . Osteoporosis 09/2014 DEXA   Worsening osteoporosis R hip.  Pt declined trial of bisphosphonate from GYN.  Repeat DEXA planned 09/2016 by GYN as per their 12/2014 office note.  . Paraesophageal hiatal hernia    Surgically repaired 09/2014  . Urinary tract infection    hx of    Past Surgical History:  Procedure Laterality Date  . COLONOSCOPY W/ POLYPECTOMY  Multiple.  Most recent 04/15/13.    2 adenomas; repeat 5 yrs (Dr. Cristina Gong)  . HIATAL HERNIA REPAIR N/A 09/27/2014   Procedure: Laparoscopic takedown of large type III hiatus hernia and Nissen fundoplication over 0000000 dilator;  Surgeon: Pedro Earls, MD;  Location: [facility];  Service: General;  Laterality: N/A;  . TOTAL HIP ARTHROPLASTY  2008   left (Dr. Noemi Chapel)  . TOTAL KNEE ARTHROPLASTY  11/2013   Right.  [facility]: Dr. Al Corpus  . VAGINAL HYSTERECTOMY  1991   fibroids (both ovaries still in)   History reviewed. No pertinent family history. History  Sexual Activity  . Sexual activity: Not on file    Outpatient Encounter Prescriptions as of 09/01/2016  Medication Sig  . buPROPion (WELLBUTRIN XL) 300 MG 24 hr tablet Take 300 mg by mouth daily.  . naproxen (NAPROSYN) 500 MG tablet Take 1 tablet (500 mg total) by mouth 2 (two) times daily with a meal.  . [DISCONTINUED] cyclobenzaprine (FLEXERIL) 5 MG tablet Take 1 tablet (5 mg total) by mouth 3 (three) times daily as needed for muscle spasms. (Patient not taking: Reported on 09/01/2016)   No facility-administered encounter medications on file as  of 09/01/2016.     Activities of Daily Living In your present state of health, do you have any difficulty performing the following activities: 09/01/2016  Hearing? N  Vision? N  Difficulty concentrating or making decisions? N  Walking or climbing stairs? N  Dressing or bathing? N  Doing errands, shopping? N  Preparing Food and eating ? N  Using the Toilet? N  In the past six months, have you accidently leaked urine? N  Do you have problems with loss of bowel control? N  Managing your Medications? N  Managing your Finances? N  Housekeeping or managing your Housekeeping? N  Some recent data might be hidden    Patient Care Team: Tammi Sou,  MD as PCP - General (Family Medicine) Ronald Lobo, MD as Consulting Physician (Gastroenterology) Johnathan Hausen, MD as Consulting Physician (General Surgery) [facility]    Assessment:    Physical assessment deferred to PCP.  Exercise Activities and Dietary recommendations Current Exercise Habits: Home exercise routine (Maintains housework, plays with grandchilden), Exercise limited by: None identified   Diet (meal preparation, eat out, water intake, caffeinated beverages, dairy products, fruits and vegetables): Drinks water throughout the day, milk, diet soda, coffee (2 cups)  Breakfast: cereal, bacon and eggs, donut Lunch: sandwich, salad, leftovers Dinner: meat and vegetables.   Discussed heart healthy diet.  Encouraged to increase activity and water intake.   Goals    . Weight (lb) < 150 lb (68 kg)          Would like to lose 17 pounds by increasing activity and decreasing sugary foods.       Fall Risk Fall Risk  09/01/2016 07/05/2015 06/13/2014  Falls in the past year? No No No   Depression Screen PHQ 2/9 Scores 09/01/2016 07/05/2015 06/13/2014  PHQ - 2 Score 0 0 0     Cognitive Function       Ad8 score reviewed for issues:  Issues making decisions:no  Less interest in hobbies / activities:no  Repeats questions, stories (family complaining):no  Trouble using ordinary gadgets (microwave, computer, phone):no  Forgets the month or year: no  Mismanaging finances: no  Remembering appts:no  Daily problems with thinking and/or memory:no Ad8 score is=0     Immunization History  Administered Date(s) Administered  . Influenza Whole 06/26/2009  . Influenza,inj,Quad PF,36+ Mos 05/13/2014  . Influenza-Unspecified 05/30/2015, 05/12/2016  . Pneumococcal Conjugate-13 06/13/2014, 05/30/2015  . Pneumococcal Polysaccharide-23 05/12/2016  . Tdap 06/13/2014  . Zoster 07/27/2013   Screening Tests Health Maintenance  Topic Date Due  .  Hepatitis C Screening  07/04/2017 (Originally 06-02-1951)  . MAMMOGRAM  09/06/2016  . COLONOSCOPY  04/16/2023  . TETANUS/TDAP  06/13/2024  . INFLUENZA VACCINE  Addressed  . DEXA SCAN  Completed  . ZOSTAVAX  Completed      Plan:     Continue to eat heart healthy diet (full of fruits, vegetables, whole grains, lean protein, water--limit salt, fat, and sugar intake) and increase physical activity as tolerated.  Continue doing brain stimulating activities (puzzles, reading, adult coloring books, staying active) to keep memory sharp.   Bring a copy of your advance directives to your next office visit.   During the course of the visit the patient was educated and counseled about the following appropriate screening and preventive services:   Vaccines to include Pneumoccal, Influenza, Hepatitis B, Td, Zostavax, HCV  Cardiovascular Disease  Colorectal cancer screening  Bone density screening  Diabetes screening  Glaucoma screening  Mammography/PAP  Nutrition counseling   Patient Instructions (the written plan) was given to the patient.   Gerilyn Nestle, RN  09/01/2016

## 2016-09-01 NOTE — Patient Instructions (Addendum)
Continue to eat heart healthy diet (full of fruits, vegetables, whole grains, lean protein, water--limit salt, fat, and sugar intake) and increase physical activity as tolerated.  Continue doing brain stimulating activities (puzzles, reading, adult coloring books, staying active) to keep memory sharp.   Bring a copy of your advance directives to your next office visit.    Fall Prevention in the Home Introduction Falls can cause injuries. They can happen to people of all ages. There are many things you can do to make your home safe and to help prevent falls. What can I do on the outside of my home?  Regularly fix the edges of walkways and driveways and fix any cracks.  Remove anything that might make you trip as you walk through a door, such as a raised step or threshold.  Trim any bushes or trees on the path to your home.  Use bright outdoor lighting.  Clear any walking paths of anything that might make someone trip, such as rocks or tools.  Regularly check to see if handrails are loose or broken. Make sure that both sides of any steps have handrails.  Any raised decks and porches should have guardrails on the edges.  Have any leaves, snow, or ice cleared regularly.  Use sand or salt on walking paths during winter.  Clean up any spills in your garage right away. This includes oil or grease spills. What can I do in the bathroom?  Use night lights.  Install grab bars by the toilet and in the tub and shower. Do not use towel bars as grab bars.  Use non-skid mats or decals in the tub or shower.  If you need to sit down in the shower, use a plastic, non-slip stool.  Keep the floor dry. Clean up any water that spills on the floor as soon as it happens.  Remove soap buildup in the tub or shower regularly.  Attach bath mats securely with double-sided non-slip rug tape.  Do not have throw rugs and other things on the floor that can make you trip. What can I do in the  bedroom?  Use night lights.  Make sure that you have a light by your bed that is easy to reach.  Do not use any sheets or blankets that are too big for your bed. They should not hang down onto the floor.  Have a firm chair that has side arms. You can use this for support while you get dressed.  Do not have throw rugs and other things on the floor that can make you trip. What can I do in the kitchen?  Clean up any spills right away.  Avoid walking on wet floors.  Keep items that you use a lot in easy-to-reach places.  If you need to reach something above you, use a strong step stool that has a grab bar.  Keep electrical cords out of the way.  Do not use floor polish or wax that makes floors slippery. If you must use wax, use non-skid floor wax.  Do not have throw rugs and other things on the floor that can make you trip. What can I do with my stairs?  Do not leave any items on the stairs.  Make sure that there are handrails on both sides of the stairs and use them. Fix handrails that are broken or loose. Make sure that handrails are as long as the stairways.  Check any carpeting to make sure that it is firmly attached to   the stairs. Fix any carpet that is loose or worn.  Avoid having throw rugs at the top or bottom of the stairs. If you do have throw rugs, attach them to the floor with carpet tape.  Make sure that you have a light switch at the top of the stairs and the bottom of the stairs. If you do not have them, ask someone to add them for you. What else can I do to help prevent falls?  Wear shoes that:  Do not have high heels.  Have rubber bottoms.  Are comfortable and fit you well.  Are closed at the toe. Do not wear sandals.  If you use a stepladder:  Make sure that it is fully opened. Do not climb a closed stepladder.  Make sure that both sides of the stepladder are locked into place.  Ask someone to hold it for you, if possible.  Clearly mark and make  sure that you can see:  Any grab bars or handrails.  First and last steps.  Where the edge of each step is.  Use tools that help you move around (mobility aids) if they are needed. These include:  Canes.  Walkers.  Scooters.  Crutches.  Turn on the lights when you go into a dark area. Replace any light bulbs as soon as they burn out.  Set up your furniture so you have a clear path. Avoid moving your furniture around.  If any of your floors are uneven, fix them.  If there are any pets around you, be aware of where they are.  Review your medicines with your doctor. Some medicines can make you feel dizzy. This can increase your chance of falling. Ask your doctor what other things that you can do to help prevent falls. This information is not intended to replace advice given to you by your health care provider. Make sure you discuss any questions you have with your health care provider. Document Released: 05/17/2009 Document Revised: 12/27/2015 Document Reviewed: 08/25/2014  2017 Elsevier  Health Maintenance, Female Introduction Adopting a healthy lifestyle and getting preventive care can go a long way to promote health and wellness. Talk with your health care provider about what schedule of regular examinations is right for you. This is a good chance for you to check in with your provider about disease prevention and staying healthy. In between checkups, there are plenty of things you can do on your own. Experts have done a lot of research about which lifestyle changes and preventive measures are most likely to keep you healthy. Ask your health care provider for more information. Weight and diet Eat a healthy diet  Be sure to include plenty of vegetables, fruits, low-fat dairy products, and lean protein.  Do not eat a lot of foods high in solid fats, added sugars, or salt.  Get regular exercise. This is one of the most important things you can do for your health.  Most  adults should exercise for at least 150 minutes each week. The exercise should increase your heart rate and make you sweat (moderate-intensity exercise).  Most adults should also do strengthening exercises at least twice a week. This is in addition to the moderate-intensity exercise. Maintain a healthy weight  Body mass index (BMI) is a measurement that can be used to identify possible weight problems. It estimates body fat based on height and weight. Your health care provider can help determine your BMI and help you achieve or maintain a healthy weight.  For   females 20 years of age and older:  A BMI below 18.5 is considered underweight.  A BMI of 18.5 to 24.9 is normal.  A BMI of 25 to 29.9 is considered overweight.  A BMI of 30 and above is considered obese. Watch levels of cholesterol and blood lipids  You should start having your blood tested for lipids and cholesterol at 68 years of age, then have this test every 5 years.  You may need to have your cholesterol levels checked more often if:  Your lipid or cholesterol levels are high.  You are older than 68 years of age.  You are at high risk for heart disease. Cancer screening Lung Cancer  Lung cancer screening is recommended for adults 55-80 years old who are at high risk for lung cancer because of a history of smoking.  A yearly low-dose CT scan of the lungs is recommended for people who:  Currently smoke.  Have quit within the past 15 years.  Have at least a 30-pack-year history of smoking. A pack year is smoking an average of one pack of cigarettes a day for 1 year.  Yearly screening should continue until it has been 15 years since you quit.  Yearly screening should stop if you develop a health problem that would prevent you from having lung cancer treatment. Breast Cancer  Practice breast self-awareness. This means understanding how your breasts normally appear and feel.  It also means doing regular breast  self-exams. Let your health care provider know about any changes, no matter how small.  If you are in your 20s or 30s, you should have a clinical breast exam (CBE) by a health care provider every 1-3 years as part of a regular health exam.  If you are 40 or older, have a CBE every year. Also consider having a breast X-ray (mammogram) every year.  If you have a family history of breast cancer, talk to your health care provider about genetic screening.  If you are at high risk for breast cancer, talk to your health care provider about having an MRI and a mammogram every year.  Breast cancer gene (BRCA) assessment is recommended for women who have family members with BRCA-related cancers. BRCA-related cancers include:  Breast.  Ovarian.  Tubal.  Peritoneal cancers.  Results of the assessment will determine the need for genetic counseling and BRCA1 and BRCA2 testing. Cervical Cancer  Your health care provider may recommend that you be screened regularly for cancer of the pelvic organs (ovaries, uterus, and vagina). This screening involves a pelvic examination, including checking for microscopic changes to the surface of your cervix (Pap test). You may be encouraged to have this screening done every 3 years, beginning at age 21.  For women ages 30-65, health care providers may recommend pelvic exams and Pap testing every 3 years, or they may recommend the Pap and pelvic exam, combined with testing for human papilloma virus (HPV), every 5 years. Some types of HPV increase your risk of cervical cancer. Testing for HPV may also be done on women of any age with unclear Pap test results.  Other health care providers may not recommend any screening for nonpregnant women who are considered low risk for pelvic cancer and who do not have symptoms. Ask your health care provider if a screening pelvic exam is right for you.  If you have had past treatment for cervical cancer or a condition that could lead  to cancer, you need Pap tests and screening for   cancer for at least 20 years after your treatment. If Pap tests have been discontinued, your risk factors (such as having a new sexual partner) need to be reassessed to determine if screening should resume. Some women have medical problems that increase the chance of getting cervical cancer. In these cases, your health care provider may recommend more frequent screening and Pap tests. Colorectal Cancer  This type of cancer can be detected and often prevented.  Routine colorectal cancer screening usually begins at 68 years of age and continues through 68 years of age.  Your health care provider may recommend screening at an earlier age if you have risk factors for colon cancer.  Your health care provider may also recommend using home test kits to check for hidden blood in the stool.  A small camera at the end of a tube can be used to examine your colon directly (sigmoidoscopy or colonoscopy). This is done to check for the earliest forms of colorectal cancer.  Routine screening usually begins at age 50.  Direct examination of the colon should be repeated every 5-10 years through 68 years of age. However, you may need to be screened more often if early forms of precancerous polyps or small growths are found. Skin Cancer  Check your skin from head to toe regularly.  Tell your health care provider about any new moles or changes in moles, especially if there is a change in a mole's shape or color.  Also tell your health care provider if you have a mole that is larger than the size of a pencil eraser.  Always use sunscreen. Apply sunscreen liberally and repeatedly throughout the day.  Protect yourself by wearing long sleeves, pants, a wide-brimmed hat, and sunglasses whenever you are outside. Heart disease, diabetes, and high blood pressure  High blood pressure causes heart disease and increases the risk of stroke. High blood pressure is more  likely to develop in:  People who have blood pressure in the high end of the normal range (130-139/85-89 mm Hg).  People who are overweight or obese.  People who are African American.  If you are 18-39 years of age, have your blood pressure checked every 3-5 years. If you are 40 years of age or older, have your blood pressure checked every year. You should have your blood pressure measured twice-once when you are at a hospital or clinic, and once when you are not at a hospital or clinic. Record the average of the two measurements. To check your blood pressure when you are not at a hospital or clinic, you can use:  An automated blood pressure machine at a pharmacy.  A home blood pressure monitor.  If you are between 55 years and 79 years old, ask your health care provider if you should take aspirin to prevent strokes.  Have regular diabetes screenings. This involves taking a blood sample to check your fasting blood sugar level.  If you are at a normal weight and have a low risk for diabetes, have this test once every three years after 68 years of age.  If you are overweight and have a high risk for diabetes, consider being tested at a younger age or more often. Preventing infection Hepatitis B  If you have a higher risk for hepatitis B, you should be screened for this virus. You are considered at high risk for hepatitis B if:  You were born in a country where hepatitis B is common. Ask your health care provider which countries   are considered high risk.  Your parents were born in a high-risk country, and you have not been immunized against hepatitis B (hepatitis B vaccine).  You have HIV or AIDS.  You use needles to inject street drugs.  You live with someone who has hepatitis B.  You have had sex with someone who has hepatitis B.  You get hemodialysis treatment.  You take certain medicines for conditions, including cancer, organ transplantation, and autoimmune  conditions. Hepatitis C  Blood testing is recommended for:  Everyone born from 1945 through 1965.  Anyone with known risk factors for hepatitis C. Sexually transmitted infections (STIs)  You should be screened for sexually transmitted infections (STIs) including gonorrhea and chlamydia if:  You are sexually active and are younger than 68 years of age.  You are older than 68 years of age and your health care provider tells you that you are at risk for this type of infection.  Your sexual activity has changed since you were last screened and you are at an increased risk for chlamydia or gonorrhea. Ask your health care provider if you are at risk.  If you do not have HIV, but are at risk, it may be recommended that you take a prescription medicine daily to prevent HIV infection. This is called pre-exposure prophylaxis (PrEP). You are considered at risk if:  You are sexually active and do not regularly use condoms or know the HIV status of your partner(s).  You take drugs by injection.  You are sexually active with a partner who has HIV. Talk with your health care provider about whether you are at high risk of being infected with HIV. If you choose to begin PrEP, you should first be tested for HIV. You should then be tested every 3 months for as long as you are taking PrEP. Pregnancy  If you are premenopausal and you may become pregnant, ask your health care provider about preconception counseling.  If you may become pregnant, take 400 to 800 micrograms (mcg) of folic acid every day.  If you want to prevent pregnancy, talk to your health care provider about birth control (contraception). Osteoporosis and menopause  Osteoporosis is a disease in which the bones lose minerals and strength with aging. This can result in serious bone fractures. Your risk for osteoporosis can be identified using a bone density scan.  If you are 65 years of age or older, or if you are at risk for  osteoporosis and fractures, ask your health care provider if you should be screened.  Ask your health care provider whether you should take a calcium or vitamin D supplement to lower your risk for osteoporosis.  Menopause may have certain physical symptoms and risks.  Hormone replacement therapy may reduce some of these symptoms and risks. Talk to your health care provider about whether hormone replacement therapy is right for you. Follow these instructions at home:  Schedule regular health, dental, and eye exams.  Stay current with your immunizations.  Do not use any tobacco products including cigarettes, chewing tobacco, or electronic cigarettes.  If you are pregnant, do not drink alcohol.  If you are breastfeeding, limit how much and how often you drink alcohol.  Limit alcohol intake to no more than 1 drink per day for nonpregnant women. One drink equals 12 ounces of beer, 5 ounces of wine, or 1 ounces of hard liquor.  Do not use street drugs.  Do not share needles.  Ask your health care provider for help   you need support or information about quitting drugs.  Tell your health care provider if you often feel depressed.  Tell your health care provider if you have ever been abused or do not feel safe at home. This information is not intended to replace advice given to you by your health care provider. Make sure you discuss any questions you have with your health care provider. Document Released: 02/03/2011 Document Revised: 12/27/2015 Document Reviewed: 04/24/2015  2017 [employer]

## 2016-09-07 NOTE — Progress Notes (Signed)
AWV reviewed and agree.  Signed:  Crissie Sickles, MD           09/07/2016

## 2016-10-12 IMAGING — US US ABDOMEN COMPLETE
1 series · 14 of 25 positions shown · non-contrast
Comparison: None.

CLINICAL DATA: Epigastric abdominal pain for 2 months. Initial
encounter.

EXAM:
ULTRASOUND ABDOMEN COMPLETE

[Series 1: us abdomen complete · 0.32mm/px · 14 of 90 slices shown]
[im 1/90]
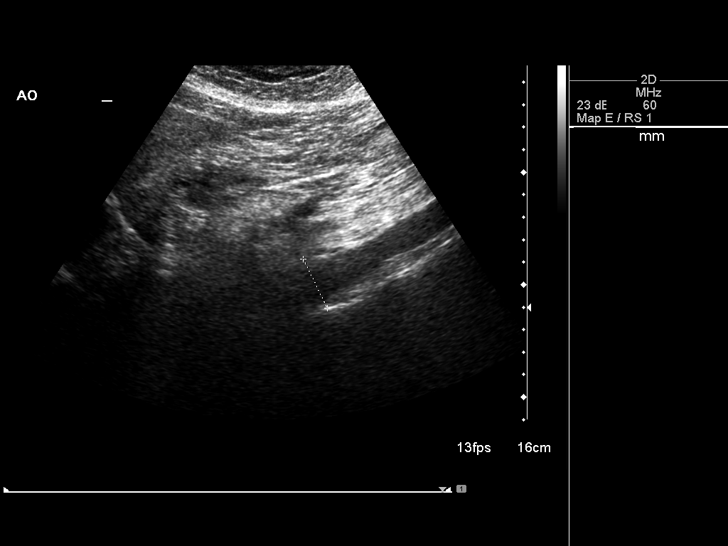
[im 8/90]
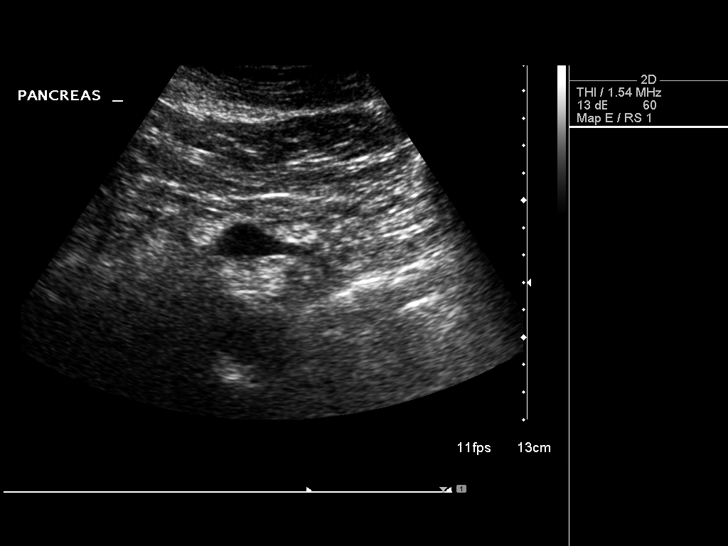
[im 15/90]
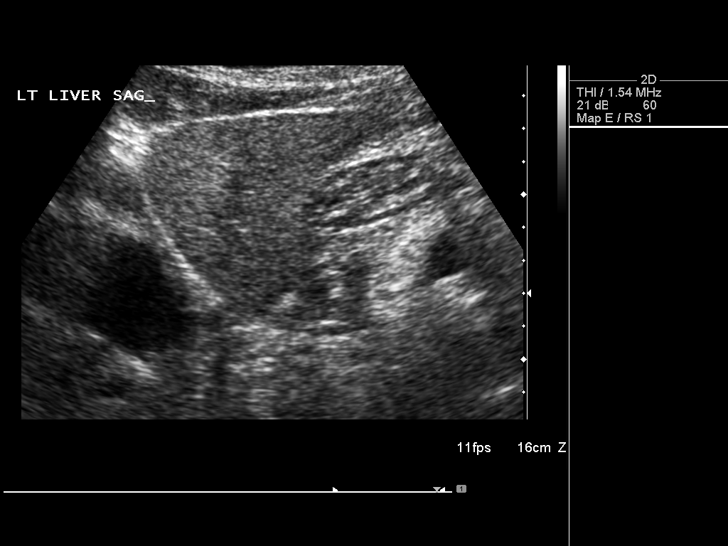
[im 23/90]
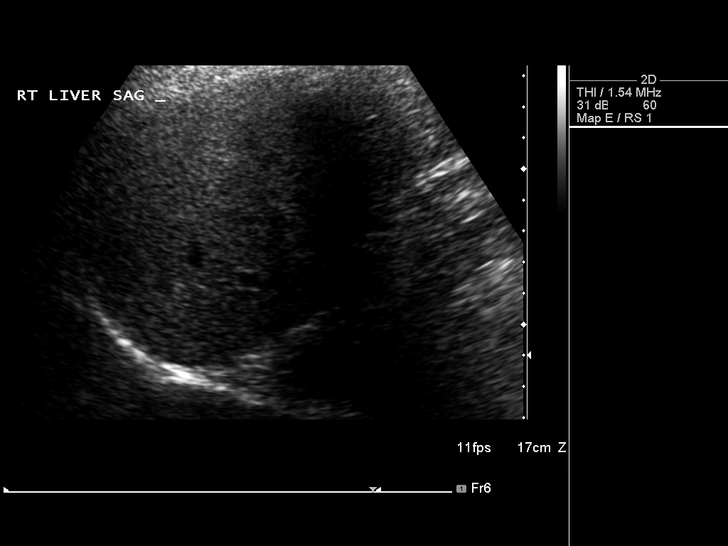
[im 30/90]
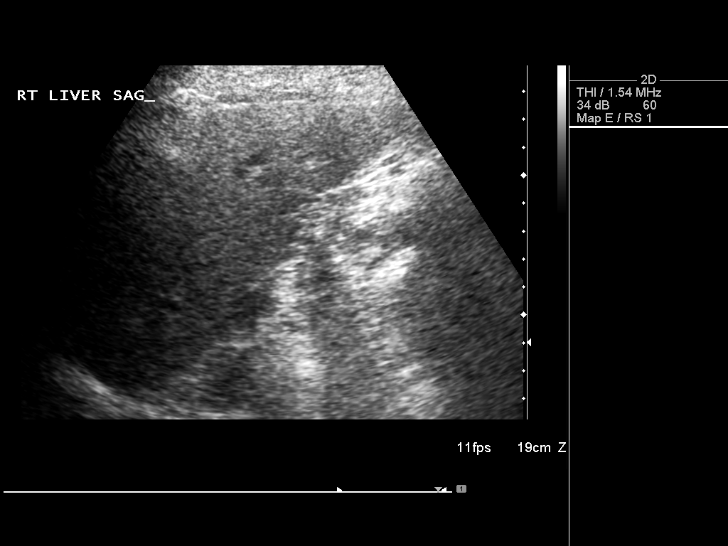
[im 34/90]
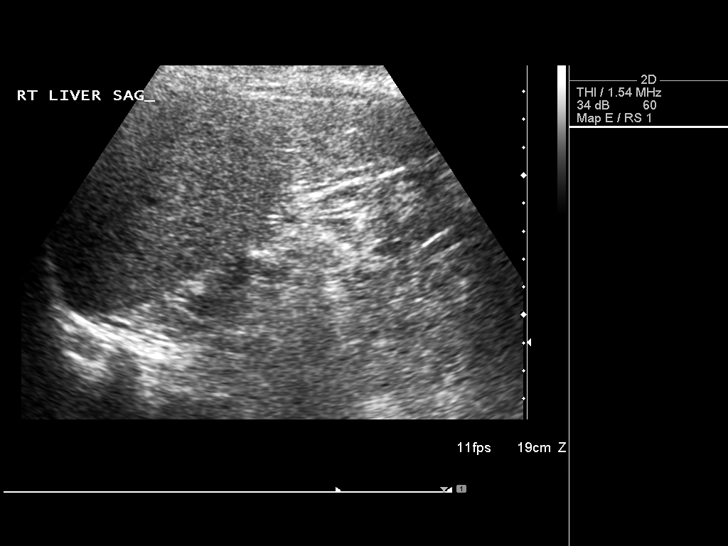
[im 41/90]
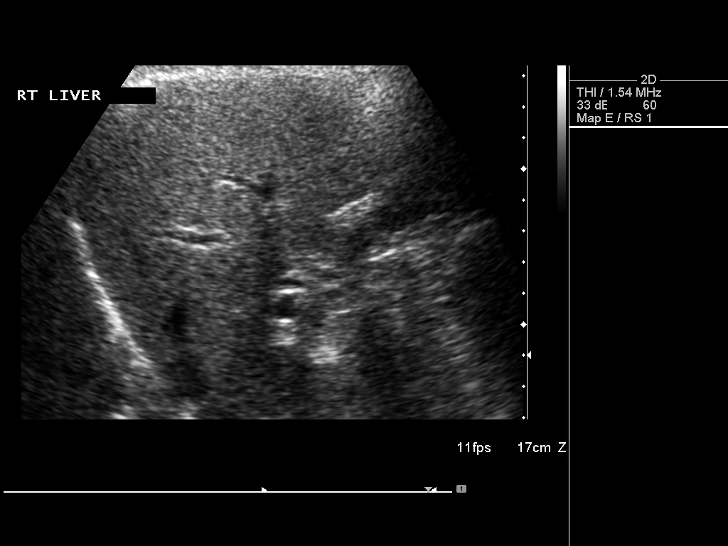
[im 49/90]
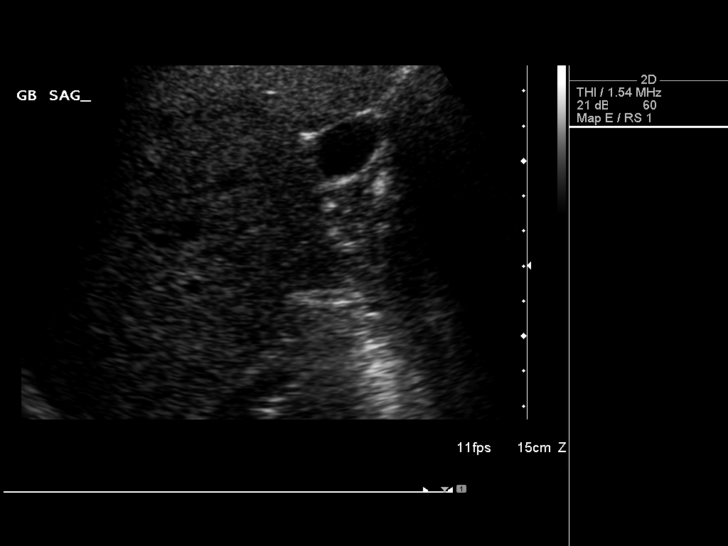
[im 56/90]
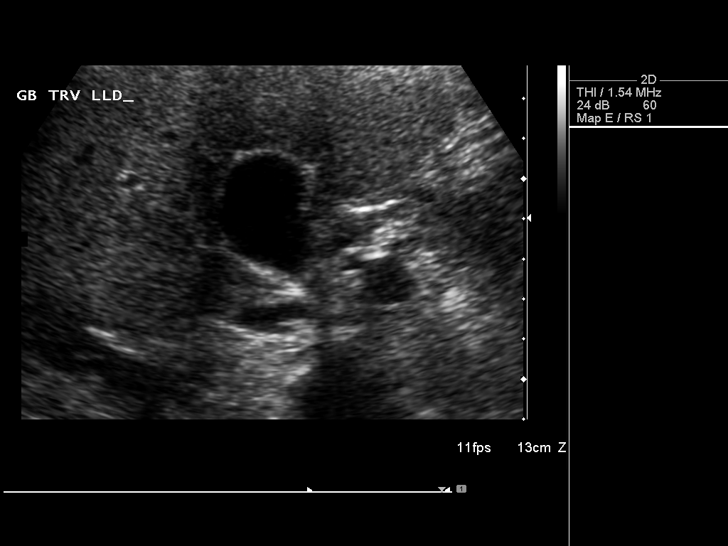
[im 60/90]
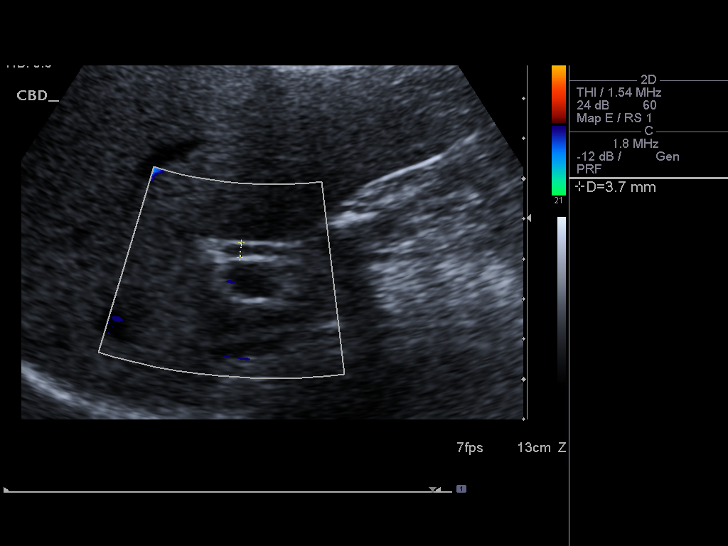
[im 67/90]
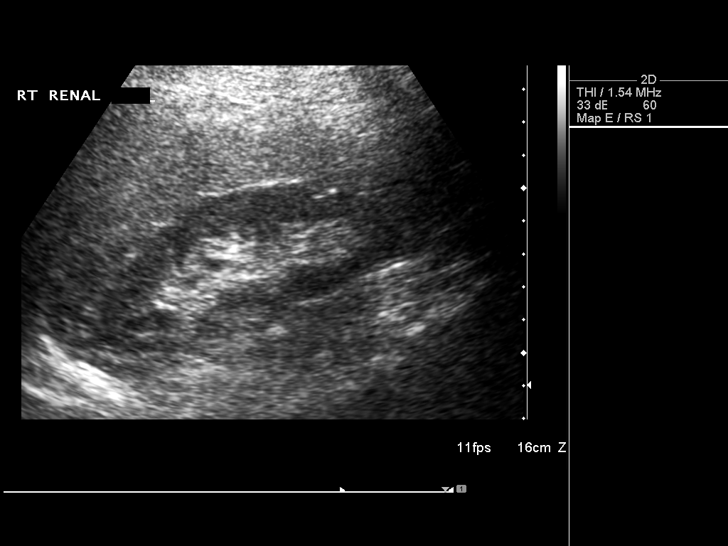
[im 75/90]
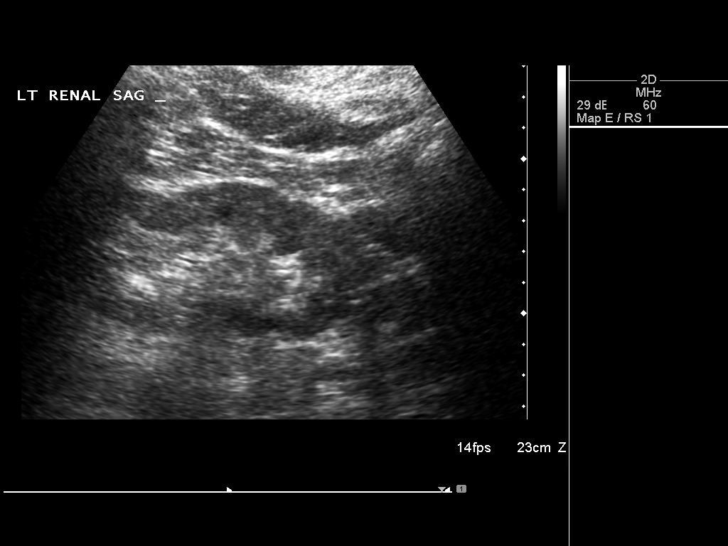
[im 82/90]
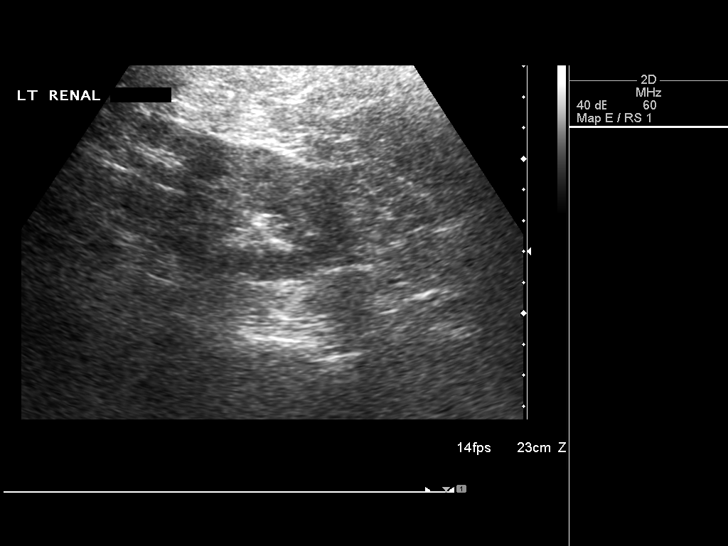
[im 90/90]
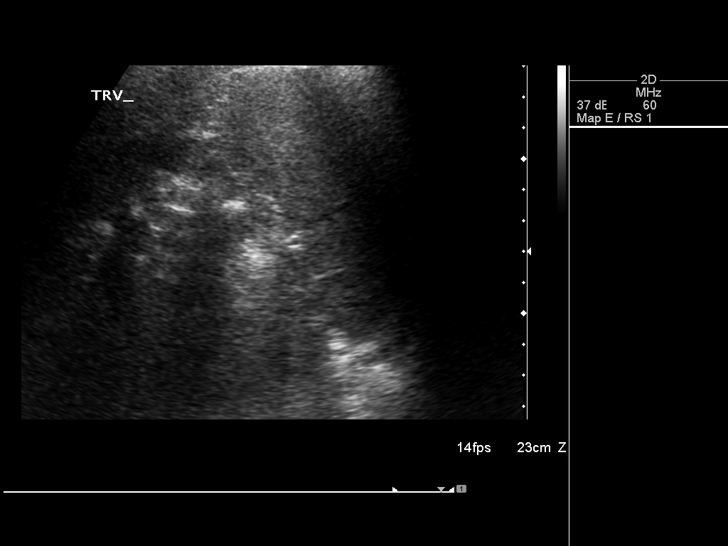

[14 of 25 positions shown; findings below may reference images not displayed]

FINDINGS: Gallbladder: No gallstones or wall thickening visualized. No
sonographic Murphy sign noted.

Common bile duct: Diameter: 3.7 mm. No evidence of
choledocholithiasis.

Liver: The hepatic echogenicity is mildly increased, most consistent
with steatosis. No focal lesions demonstrated.

IVC: No abnormality visualized. Portions of the IVC are obscured by
bowel gas.

Pancreas: Visualized portion unremarkable.

Spleen: Size and appearance within normal limits.

Right Kidney: Length: 10.0 cm. Echogenicity within normal limits. No
mass or hydronephrosis visualized.

Left Kidney: Length: 10.3 cm. Echogenicity within normal limits. No
mass or hydronephrosis visualized.

Abdominal aorta: No aneurysm visualized.

Other findings: None.
IMPRESSION: 1. No acute findings or explanation for the patient's symptoms. The
gallbladder and biliary system appear normal.
2. Increased hepatic echogenicity most consistent with steatosis.

## 2016-11-02 HISTORY — PX: EYE SURGERY: SHX253

## 2016-11-10 ENCOUNTER — Other Ambulatory Visit: Payer: Self-pay

## 2016-11-10 DIAGNOSIS — M542 Cervicalgia: Secondary | ICD-10-CM

## 2016-11-10 MED ORDER — NAPROXEN 500 MG PO TABS: 500.0000 mg | ORAL_TABLET | Freq: Two times a day (BID) | ORAL | 3 refills | Status: DC

## 2016-12-16 HISTORY — PX: OTHER SURGICAL HISTORY: SHX169

## 2016-12-19 LAB — HM DEXA SCAN

## 2016-12-19 LAB — HM MAMMOGRAPHY

## 2017-01-02 LAB — HM MAMMOGRAPHY: HM Mammogram: NORMAL (ref 0–4)

## 2017-01-15 IMAGING — CR DG CHEST 2V
3 series · 3 of 3 positions shown · non-contrast
Comparison: 08/26/2007

CLINICAL DATA: Two days postop from hiatal hernia repair. Postop
fever.

EXAM:
CHEST  2 VIEW

[w chest pa]
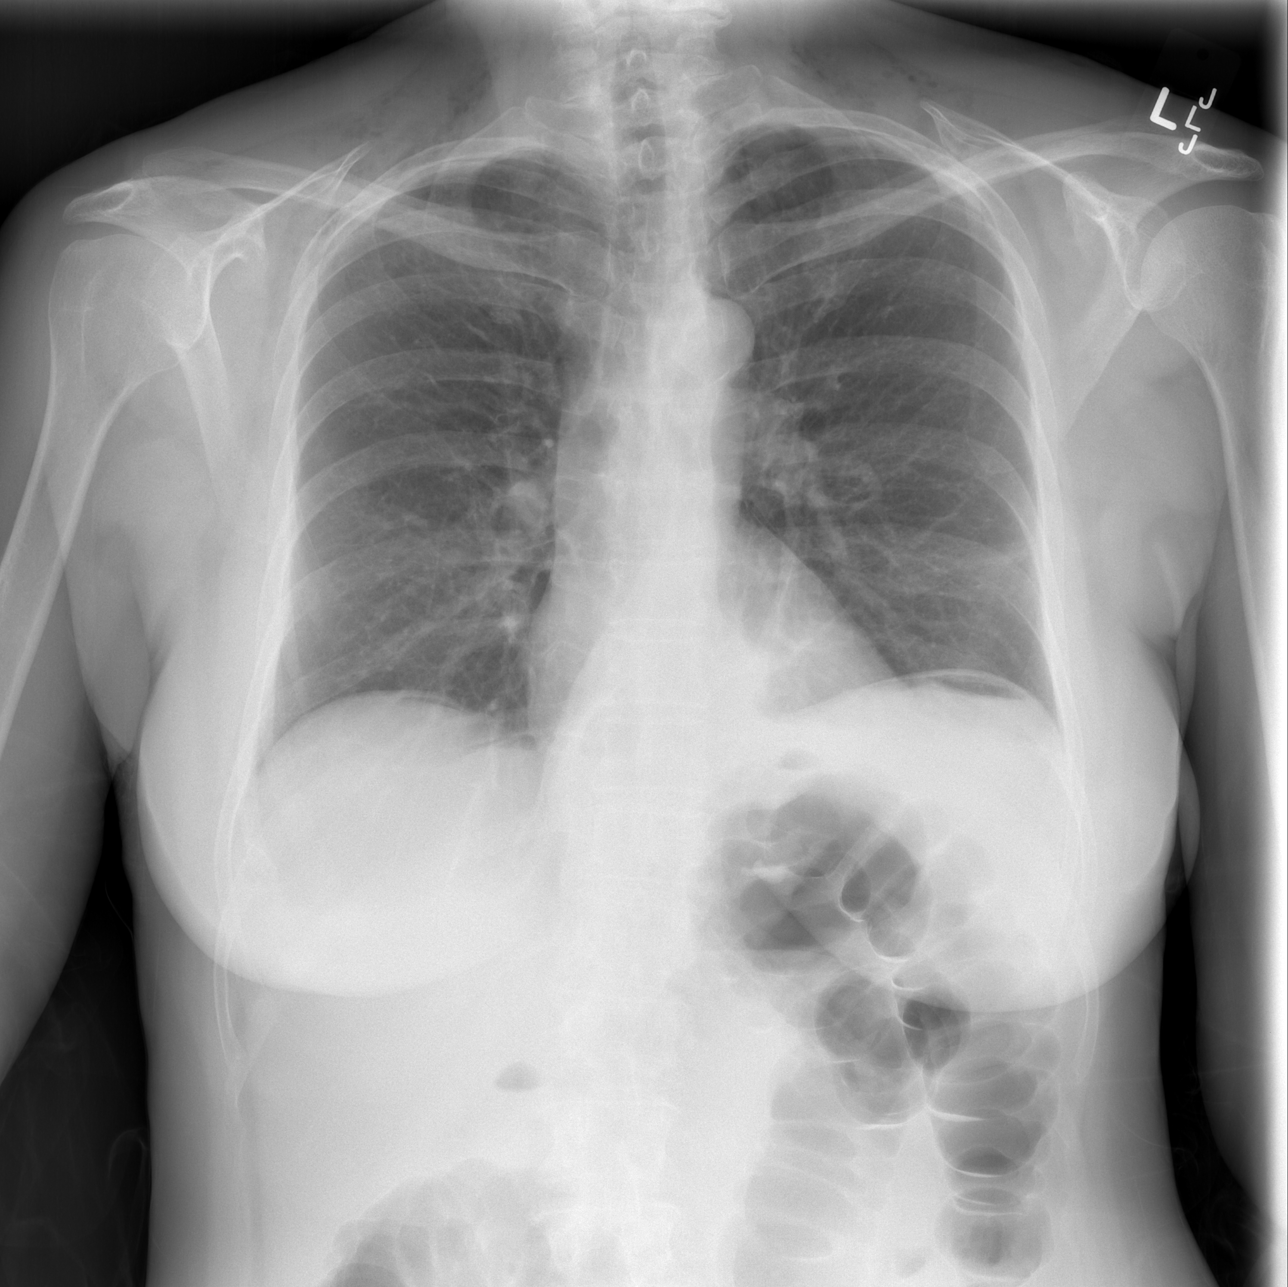

[w chest lat (1 of 2)]
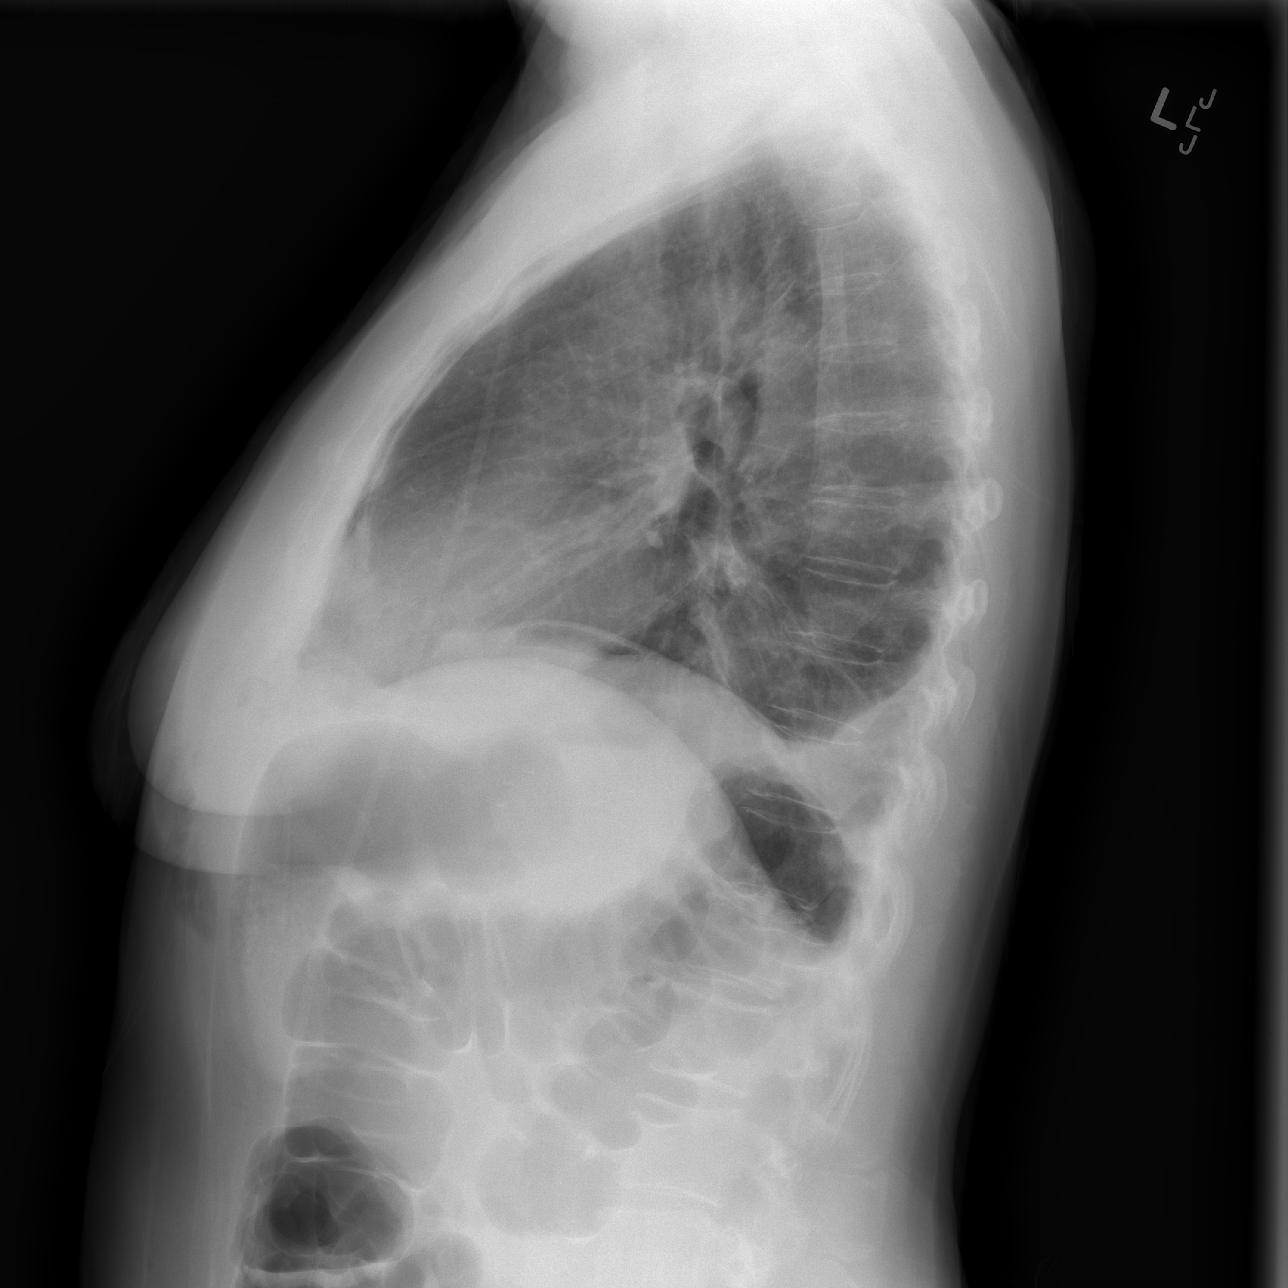

[w chest lat (2 of 2)]
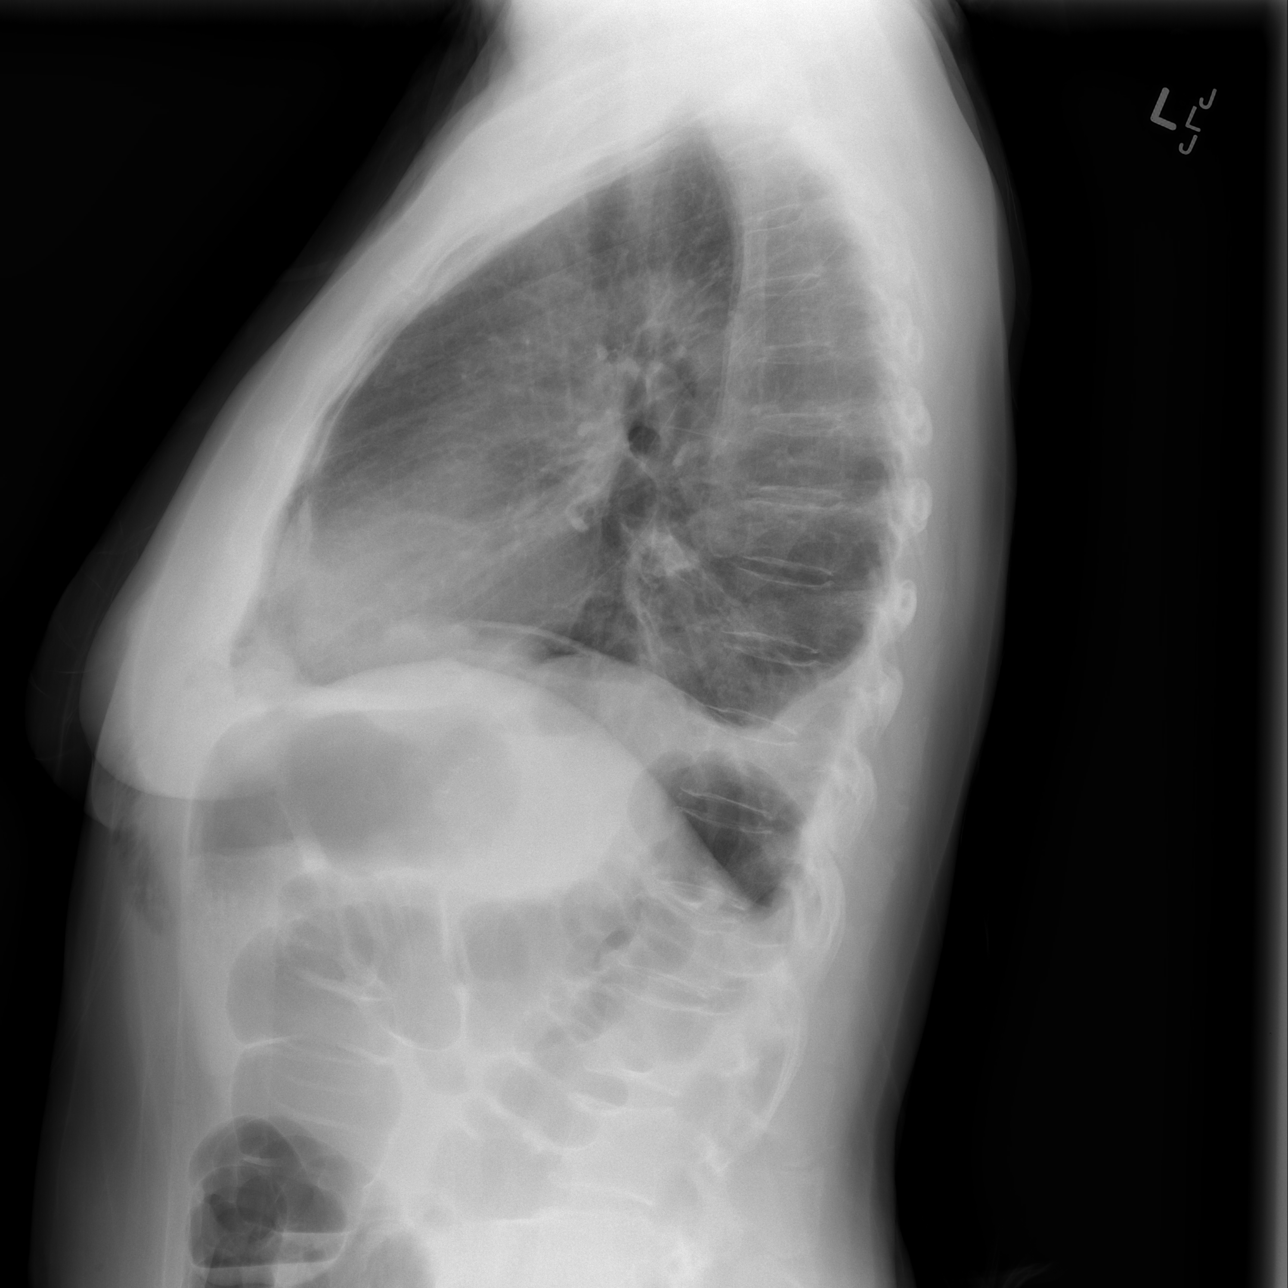

[3 of 3 positions shown; findings below may reference images not displayed]

FINDINGS: Low lung volumes are noted. Mild left basilar atelectasis is seen
with tiny left posterior pleural effusion. No evidence of pulmonary
consolidation or pneumothorax. Small amount of postop free air is
noted beneath both right and left hemidiaphragms. Heart size and
mediastinal contours are normal.
IMPRESSION: Mild left basilar atelectasis and small left pleural effusion.

Small amount of postop free air incidentally noted.

## 2017-09-03 NOTE — Progress Notes (Signed)
Subjective:   Katelyn Cuevas is a 67 y.o. female who presents for Medicare Annual (Subsequent) preventive examination.  Review of Systems:  No ROS.  Medicare Wellness Visit. Additional risk factors are reflected in the social history.  Cardiac Risk Factors include: advanced age (>17men, >32 women);family history of premature cardiovascular disease   Sleep patterns: Sleeps 7-8 hours, feels rested.  Home Safety/Smoke Alarms: Feels safe in home. Smoke alarms in place.  Living environment; residence and Firearm Safety: Lives with husband in 1 story with basement.  Seat Belt Safety/Bike Helmet: Wears seat belt.   Female:   [facility]ap-09/05/2014, [facility]       Mammo-01/02/2017 ([facility])      Dexa scan-01/02/2017 ([facility])       CCS-colonoscopy 04/15/2013, polyp. Recall 10 years     Objective:     Vitals: BP 114/60 (BP Location: Right Arm, Patient Position: Sitting, Cuff Size: Normal)   Pulse 82   Ht 5\' 6"  (1.676 m)   Wt 176 lb 12.8 oz (80.2 kg)   SpO2 96%   BMI 28.54 kg/m   Body mass index is 28.54 kg/m.  Advanced Directives 09/04/2017 09/01/2016 09/27/2014 09/19/2014 09/13/2014 09/03/2014  Does Patient Have a Medical Advance Directive? Yes Yes - Yes Yes No  Type of Advance Directive [place];Living will [place];Living will - Healthcare Power of [place] -  Does patient want to make changes to medical advance directive? - - No - Patient declined No - Patient declined No - Patient declined -  Copy of [place] in Chart? No - copy requested No - copy requested - Yes Yes -  Would patient like information on creating a medical advance directive? - - - - - No - patient declined information    Tobacco Social History   Tobacco Use  Smoking Status Never Smoker  Smokeless Tobacco Never Used     Counseling given: Not Answered    Past Medical History:    Diagnosis Date  . Depression   . GERD (gastroesophageal reflux disease)   . History of adenomatous polyp of colon   . History of hiatal hernia   . History of vitamin D deficiency    pt denies  . Osteoarthritis    Multiple joints; left THR, right TKA  . Osteoporosis 09/2014 DEXA   Worsening osteoporosis R hip.  Pt declined trial of bisphosphonate from GYN.  Repeat DEXA planned 09/2016 by GYN as per their 12/2014 office note.  . Paraesophageal hiatal hernia    Surgically repaired 09/2014  . Urinary tract infection    hx of    Past Surgical History:  Procedure Laterality Date  . COLONOSCOPY W/ POLYPECTOMY  Multiple.  Most recent 04/15/13.    2 adenomas; repeat 5 yrs (Dr. Cristina Gong)  . EYE SURGERY Right 11/2016   cataract   . HIATAL HERNIA REPAIR N/A 09/27/2014   Procedure: Laparoscopic takedown of large type III hiatus hernia and Nissen fundoplication over #93 dilator;  Surgeon: Pedro Earls, MD;  Location: [facility];  Service: General;  Laterality: N/A;  . PARS PLANA VITRECTOMY W/ REPAIR OF MACULAR HOLE  07/21/2016  . TOTAL HIP ARTHROPLASTY  2008   left (Dr. Noemi Chapel)  . TOTAL KNEE ARTHROPLASTY  11/2013   Right.  [facility]: Dr. Al Corpus  . VAGINAL HYSTERECTOMY  1991   fibroids (both ovaries still in)   Family History  Problem Relation Age of Onset  .  Emphysema Mother   . Stroke Father   . Hypertension Father   . Hyperlipidemia Father   . Heart disease Sister        Afib  . Retinal detachment Brother   . Cancer Sister        bladder   Social History   Socioeconomic History  . Marital status: Married    Spouse name: None  . Number of children: None  . Years of education: None  . Highest education level: None  Social Needs  . Financial resource strain: None  . Food insecurity - worry: None  . Food insecurity - inability: None  . Transportation needs - medical: None  . Transportation needs - non-medical: None  Occupational History  . None  Tobacco Use  . Smoking status: Never  Smoker  . Smokeless tobacco: Never Used  Substance and Sexual Activity  . Alcohol use: No  . Drug use: No  . Sexual activity: None  Other Topics Concern  . None  Social History Narrative    Married, 2 children.   Occupation: real estate agent   No T/A/Ds.       Outpatient Encounter Medications as of 09/04/2017  Medication Sig  . naproxen (NAPROSYN) 500 MG tablet Take 1 tablet (500 mg total) by mouth 2 (two) times daily with a meal. (Patient taking differently: Take 500 mg by mouth 2 (two) times daily with a meal. )  . buPROPion (WELLBUTRIN XL) 300 MG 24 hr tablet Take 300 mg by mouth daily.  Marland Kitchen FLUZONE HIGH-DOSE 0.5 ML injection TO BE ADMINISTERED BY PHARMACIST FOR IMMUNIZATION   No facility-administered encounter medications on file as of 09/04/2017.     Activities of Daily Living In your present state of health, do you have any difficulty performing the following activities: 09/04/2017  Hearing? N  Vision? N  Difficulty concentrating or making decisions? N  Walking or climbing stairs? N  Dressing or bathing? N  Doing errands, shopping? N  Preparing Food and eating ? N  Using the Toilet? N  In the past six months, have you accidently leaked urine? N  Do you have problems with loss of bowel control? N  Managing your Medications? N  Managing your Finances? N  Housekeeping or managing your Housekeeping? N  Some recent data might be hidden    Patient Care Team: Tammi Sou, MD as PCP - General (Family Medicine) Ronald Lobo, MD as Consulting Physician (Gastroenterology) Johnathan Hausen, MD as Consulting Physician (General Surgery) Alvin, [facility] Determ[facility], [facility], MD as Referring Physician (Orthopedic Surgery)    Assessment:   This is a routine wellness examination for [place].  Exercise Activities and Dietary recommendations Current Exercise Habits: The patient does not participate in regular exercise at  present, Exercise limited by: None identified   Diet (meal preparation, eat out, water intake, caffeinated beverages, dairy products, fruits and vegetables): Drinks water and diet soda.   Breakfast: bacon/eggs; cereal; oatmeal Lunch: sandwich; salad Dinner: protein and vegetable     Goals    . Patient Stated     Remaining active.     . Weight (lb) < 150 lb (68 kg)     Would like to lose 17 pounds by increasing activity and decreasing sugary foods.        Fall Risk Fall Risk  09/04/2017 09/01/2016 07/05/2015 06/13/2014  Falls in the past year? No No No No    Depression Screen PHQ 2/9 Scores 09/04/2017 09/01/2016  07/05/2015 06/13/2014  PHQ - 2 Score 0 0 0 0     Cognitive Function       Ad8 score reviewed for issues:  Issues making decisions: no  Less interest in hobbies / activities: no  Repeats questions, stories (family complaining): no  Trouble using ordinary gadgets (microwave, computer, phone): no  Forgets the month or year: no  Mismanaging finances: no  Remembering appts: no  Daily problems with thinking and/or memory: no Ad8 score is=0     Immunization History  Administered Date(s) Administered  . Influenza Whole 06/26/2009  . Influenza,inj,Quad PF,6+ Mos 05/13/2014  . Influenza-Unspecified 05/30/2015, 05/12/2016, 06/03/2017  . Pneumococcal Conjugate-13 06/13/2014, 05/30/2015  . Pneumococcal Polysaccharide-23 05/12/2016  . Tdap 06/13/2014  . Zoster 07/27/2013     Screening Tests Health Maintenance  Topic Date Due  . MAMMOGRAM  09/06/2016  . Hepatitis C Screening  09/04/2018 (Originally [birth year])  . COLONOSCOPY  04/16/2023  . TETANUS/TDAP  06/13/2024  . INFLUENZA VACCINE  Completed  . DEXA SCAN  Completed        Plan:    Bring a copy of your living will and/or healthcare power of attorney to your next office visit.  Continue doing brain stimulating activities (puzzles, reading, adult coloring books, staying active) to keep memory sharp.     I have personally reviewed and noted the following in the patient's chart:   . Medical and social history . Use of alcohol, tobacco or illicit drugs  . Current medications and supplements . Functional ability and status . Nutritional status . Physical activity . Advanced directives . List of other physicians . Hospitalizations, surgeries, and ER visits in previous 12 months . Vitals . Screenings to include cognitive, depression, and falls . Referrals and appointments  In addition, I have reviewed and discussed with patient certain preventive protocols, quality metrics, and best practice recommendations. A written personalized care plan for preventive services as well as general preventive health recommendations were provided to patient.     Gerilyn Nestle, RN  09/04/2017  PCP Notes: -Declines Shingrix -F/U with PCP 12/2017 for CPE

## 2017-09-04 ENCOUNTER — Other Ambulatory Visit: Payer: Self-pay

## 2017-09-04 ENCOUNTER — Ambulatory Visit (INDEPENDENT_AMBULATORY_CARE_PROVIDER_SITE_OTHER): Payer: Medicare Other

## 2017-09-04 VITALS — BP 114/60 | HR 82 | Ht 66.0 in | Wt 176.8 lb

## 2017-09-04 DIAGNOSIS — Z0000 Encounter for general adult medical examination without abnormal findings: Secondary | ICD-10-CM | POA: Diagnosis not present

## 2017-09-04 NOTE — Patient Instructions (Addendum)

## 2017-09-07 ENCOUNTER — Encounter: Payer: Self-pay | Admitting: Family Medicine

## 2017-09-14 ENCOUNTER — Encounter: Payer: Self-pay | Admitting: Family Medicine

## 2017-09-14 ENCOUNTER — Ambulatory Visit: Payer: Medicare Other | Admitting: Family Medicine

## 2017-09-14 VITALS — BP 127/77 | HR 77 | Temp 98.2°F | Ht 66.0 in | Wt 173.1 lb

## 2017-09-14 DIAGNOSIS — J40 Bronchitis, not specified as acute or chronic: Secondary | ICD-10-CM

## 2017-09-14 MED ORDER — AMOXICILLIN-POT CLAVULANATE 875-125 MG PO TABS: 1.0000 | ORAL_TABLET | Freq: Two times a day (BID) | ORAL | 0 refills | Status: DC

## 2017-09-14 MED ORDER — BENZONATATE 200 MG PO CAPS: 200.0000 mg | ORAL_CAPSULE | Freq: Two times a day (BID) | ORAL | 0 refills | Status: DC | PRN

## 2017-09-14 NOTE — Progress Notes (Signed)
Katelyn Cuevas , 1951/02/26, 67 y.o., female MRN: [id number] Patient Care Team    Relationship Specialty Notifications Start End  McGowen, Adrian [place], MD PCP - General Family Medicine  06/13/14    Comment: Dr. Linna Darner retirement  Ronald Lobo, MD Consulting Physician Gastroenterology  06/13/14   Johnathan Hausen, MD Consulting Physician General Surgery  10/02/14   Lady Gary, [facility]    09/01/16   Determatology, [place]    09/04/17   Henreitta Leber, MD Referring Physician Orthopedic Surgery  09/04/17     Chief Complaint  Patient presents with  . Cough    pt c/o of spasm of cough X 7days.pt states that she feel exhausted.Try OTC cough meds but no relief.     Subjective: Pt presents for an OV with complaints of cough of greater than 1 week duration.  Associated symptoms include nasal drainage, dry cough, feeling exhausted. Her husband was ill the week prior, but has been able to get over illness. She feels her ears continues to hang around. Pt has tried nothing to ease their symptoms. She denies fever, chills, nausea, vomit, diarrhea or rash. She denies shortness of breath or wheezing.  Depression screen [facility] 2/9 09/04/2017 09/01/2016 07/05/2015 06/13/2014  Decreased Interest 0 0 0 0  Down, Depressed, Hopeless 0 0 0 0  PHQ - 2 Score 0 0 0 0    No Known Allergies Social History   Tobacco Use  . Smoking status: Never Smoker  . Smokeless tobacco: Never Used  Substance Use Topics  . Alcohol use: No   Past Medical History:  Diagnosis Date  . Depression   . GERD (gastroesophageal reflux disease)   . History of adenomatous polyp of colon   . History of hiatal hernia   . History of vitamin D deficiency    pt denies  . Osteoarthritis    Multiple joints; left THR, right TKA  . Osteoporosis 09/2014 DEXA   Worsening osteoporosis R hip.  Pt declined trial of bisphosphonate from GYN.  Repeat DEXA planned 09/2016 by GYN as per their 12/2014 office note.  .  Paraesophageal hiatal hernia    Surgically repaired 09/2014  . Urinary tract infection    hx of    Past Surgical History:  Procedure Laterality Date  . COLONOSCOPY W/ POLYPECTOMY  Multiple.  Most recent 04/15/13.    2 adenomas; repeat 5 yrs (Dr. Cristina Gong)  . EYE SURGERY Right 11/2016   cataract   . HIATAL HERNIA REPAIR N/A 09/27/2014   Procedure: Laparoscopic takedown of large type III hiatus hernia and Nissen fundoplication over #27 dilator;  Surgeon: Pedro Earls, MD;  Location: [facility];  Service: General;  Laterality: N/A;  . PARS PLANA VITRECTOMY W/ REPAIR OF MACULAR HOLE  07/21/2016  . TOTAL HIP ARTHROPLASTY  2008   left (Dr. Noemi Chapel)  . TOTAL KNEE ARTHROPLASTY  11/2013   Right.  [facility]: Dr. Al Corpus  . VAGINAL HYSTERECTOMY  1991   fibroids (both ovaries still in)   Family History  Problem Relation Age of Onset  . Emphysema Mother   . Stroke Father   . Hypertension Father   . Hyperlipidemia Father   . Heart disease Sister        Afib  . Retinal detachment Brother   . Cancer Sister        bladder   Allergies as of 09/14/2017   No Known Allergies     Medication List  Accurate as of 09/14/17 11:05 AM. Always use your most recent med list.          naproxen 500 MG tablet Commonly known as:  NAPROSYN Take 1 tablet (500 mg total) by mouth 2 (two) times daily with a meal.       All past medical history, surgical history, allergies, family history, immunizations andmedications were updated in the EMR today and reviewed under the history and medication portions of their EMR.     ROS: Negative, with the exception of above mentioned in HPI   Objective:  BP 127/77 (BP Location: Left Arm, Patient Position: Sitting, Cuff Size: Normal)   Pulse 77   Temp 98.2 F (36.8 C) (Oral)   Ht 5\' 6"  (1.676 m)   Wt 173 lb 1.9 oz (78.5 kg)   SpO2 97%   BMI 27.94 kg/m  Body mass index is 27.94 kg/m. Gen: Afebrile. No acute distress. Nontoxic in appearance, well developed,  well nourished.  HENT: AT. [place]. Bilateral TM visualized without erythema or fullness. MMM, no oral lesions. Bilateral nares mild erythema and drainage present. Throat without erythema or exudates. Cough and hoarseness present. no tenderness to maxillary sinus. Eyes:Pupils Equal Round Reactive to light, Extraocular movements intact,  Conjunctiva without redness, discharge or icterus. Neck/lymp/endocrine: Supple, very mild right anterior cervical lymphadenopathy CV: RRR no murmur, no edema Chest: CTAB, no wheeze or crackles. Good air movement, normal resp effort.  Abd: Soft. NTND. BS present.  Skin: No rashes, purpura or petechiae.  Neuro:  Normal gait. PERLA. EOMi. Alert. Oriented x3  No exam data present No results found. No results found for this or any previous visit (from the past 24 hour(s)).  Assessment/Plan: Katelyn Cuevas is a 67 y.o. female present for OV for  1. Bronchitis Rest, hydrate.  + flonase, mucinex (DM if cough), nettie pot or nasal saline.  Tessalon Perles prescribed for cough. Augmentin prescribed, take until completed.  If cough present it can last up to 6-8 weeks.  F/U 2 weeks of not improved.    Reviewed expectations re: course of current medical issues.  Discussed self-management of symptoms.  Outlined signs and symptoms indicating need for more acute intervention.  Patient verbalized understanding and all questions were answered.  Patient received an After-Visit Summary.    No orders of the defined types were placed in this encounter.    Note is dictated utilizing voice recognition software. Although note has been proof read prior to signing, occasional typographical errors still can be missed. If any questions arise, please do not hesitate to call for verification.   electronically signed by:  Howard Pouch, DO  [place]

## 2017-09-14 NOTE — Patient Instructions (Signed)
Rest, hydrate.  + flonase, mucinex (DM if cough), nettie pot or nasal saline.  Augmentin every 12 hours with food prescribed, take until completed.  If cough present it can last up to 6-8 weeks.  F/U 2 weeks of not improved.     Acute Bronchitis, Adult Acute bronchitis is when air tubes (bronchi) in the lungs suddenly get swollen. The condition can make it hard to breathe. It can also cause these symptoms:  A cough.  Coughing up clear, yellow, or green mucus.  Wheezing.  Chest congestion.  Shortness of breath.  A fever.  Body aches.  Chills.  A sore throat.  Follow these instructions at home: Medicines  Take over-the-counter and prescription medicines only as told by your doctor.  If you were prescribed an antibiotic medicine, take it as told by your doctor. Do not stop taking the antibiotic even if you start to feel better. General instructions  Rest.  Drink enough fluids to keep your pee (urine) clear or pale yellow.  Avoid smoking and secondhand smoke. If you smoke and you need help quitting, ask your doctor. Quitting will help your lungs heal faster.  Use an inhaler, cool mist vaporizer, or humidifier as told by your doctor.  Keep all follow-up visits as told by your doctor. This is important. How is this prevented? To lower your risk of getting this condition again:  Wash your hands often with soap and water. If you cannot use soap and water, use hand sanitizer.  Avoid contact with people who have cold symptoms.  Try not to touch your hands to your mouth, nose, or eyes.  Make sure to get the flu shot every year.  Contact a doctor if:  Your symptoms do not get better in 2 weeks. Get help right away if:  You cough up blood.  You have chest pain.  You have very bad shortness of breath.  You become dehydrated.  You faint (pass out) or keep feeling like you are going to pass out.  You keep throwing up (vomiting).  You have a very bad  headache.  Your fever or chills gets worse. This information is not intended to replace advice given to you by your health care provider. Make sure you discuss any questions you have with your health care provider. Document Released: 01/07/2008 Document Revised: 02/27/2016 Document Reviewed: 01/09/2016 Elsevier Interactive Patient Education  [employer].

## 2017-09-15 ENCOUNTER — Encounter: Payer: Self-pay | Admitting: Family Medicine

## 2017-09-22 NOTE — Progress Notes (Signed)
AWV reviewed and agree.  Signed:  Phil McGowen, MD           09/22/2017  

## 2017-12-09 ENCOUNTER — Encounter: Payer: Self-pay | Admitting: Family Medicine

## 2017-12-09 ENCOUNTER — Ambulatory Visit (INDEPENDENT_AMBULATORY_CARE_PROVIDER_SITE_OTHER): Payer: Medicare Other | Admitting: Family Medicine

## 2017-12-09 VITALS — BP 106/63 | HR 80 | Temp 98.1°F | Resp 16 | Ht 66.0 in | Wt 172.5 lb

## 2017-12-09 DIAGNOSIS — Z0000 Encounter for general adult medical examination without abnormal findings: Secondary | ICD-10-CM

## 2017-12-09 DIAGNOSIS — H6121 Impacted cerumen, right ear: Secondary | ICD-10-CM

## 2017-12-09 DIAGNOSIS — E663 Overweight: Secondary | ICD-10-CM

## 2017-12-09 NOTE — Progress Notes (Signed)
Office Note 12/09/2017  CC:  Chief Complaint  Patient presents with  . Annual Exam    Pt is not fasting.     HPI:  Katelyn Cuevas is a 67 y.o. White female who is here for annual health maintenance exam.  Has GYN appt scheduled: gets mammograms through their office.  Exercise: taking care of grandkids a lot, working in garden/yard, no formal regimen. Diet: fairly healthy, "too many sweets". Says she is not real motivated to lose wt.  Takes naproxen only occ for mild musculo pain.  Works well.  Past Medical History:  Diagnosis Date  . Depression   . GERD (gastroesophageal reflux disease)   . History of adenomatous polyp of colon   . History of hiatal hernia   . History of vitamin D deficiency    pt denies  . Osteoarthritis    Multiple joints; left THR, right TKA  . Osteoporosis 09/2014 DEXA   Worsening osteoporosis R hip.  Pt declined trial of bisphosphonate from GYN.  Repeat DEXA planned 09/2016 by GYN as per their 12/2014 office note.  . Paraesophageal hiatal hernia    Surgically repaired 09/2014  . Urinary tract infection    hx of     Past Surgical History:  Procedure Laterality Date  . COLONOSCOPY W/ POLYPECTOMY  Multiple.  Most recent 04/15/13.    2 adenomas; repeat 5 yrs (Dr. Cristina Gong)  . DEXA  12/16/2016   T-score -2.5 (improved)-Dr. McComb.  Repeat 3 yrs.  Marland Kitchen EYE SURGERY Right 11/2016   cataract   . HIATAL HERNIA REPAIR N/A 09/27/2014   Procedure: Laparoscopic takedown of large type III hiatus hernia and Nissen fundoplication over #43 dilator;  Surgeon: Pedro Earls, MD;  Location: [facility];  Service: General;  Laterality: N/A;  . PARS PLANA VITRECTOMY W/ REPAIR OF MACULAR HOLE  07/21/2016  . TOTAL HIP ARTHROPLASTY  2008   left (Dr. Noemi Chapel)  . TOTAL KNEE ARTHROPLASTY  11/2013   Right.  [facility]: Dr. Al Corpus  . VAGINAL HYSTERECTOMY  1991   fibroids (both ovaries still in)    Family History  Problem Relation Age of Onset  . Emphysema Mother   . Stroke  Father   . Hypertension Father   . Hyperlipidemia Father   . Heart disease Sister        Afib  . Retinal detachment Brother   . Cancer Sister        bladder    Social History   Socioeconomic History  . Marital status: Married    Spouse name: Not on file  . Number of children: Not on file  . Years of education: Not on file  . Highest education level: Not on file  Occupational History  . Not on file  Social Needs  . Financial resource strain: Not on file  . Food insecurity:    Worry: Not on file    Inability: Not on file  . Transportation needs:    Medical: Not on file    Non-medical: Not on file  Tobacco Use  . Smoking status: Never Smoker  . Smokeless tobacco: Never Used  Substance and Sexual Activity  . Alcohol use: No  . Drug use: No  . Sexual activity: Not on file  Lifestyle  . Physical activity:    Days per week: Not on file    Minutes per session: Not on file  . Stress: Not on file  Relationships  . Social connections:    Talks on phone: Not  on file    Gets together: Not on file    Attends religious service: Not on file    Active member of club or organization: Not on file    Attends meetings of clubs or organizations: Not on file    Relationship status: Not on file  . Intimate partner violence:    Fear of current or ex partner: Not on file    Emotionally abused: Not on file    Physically abused: Not on file    Forced sexual activity: Not on file  Other Topics Concern  . Not on file  Social History Narrative    Married, 2 children.   Occupation: real estate agent   No T/A/Ds.       Outpatient Medications Prior to Visit  Medication Sig Dispense Refill  . naproxen (NAPROSYN) 500 MG tablet Take 1 tablet (500 mg total) by mouth 2 (two) times daily with a meal. (Patient taking differently: Take 500 mg by mouth 2 (two) times daily with a meal. ) 30 tablet 3  . amoxicillin-clavulanate (AUGMENTIN) 875-125 MG tablet Take 1 tablet by mouth 2 (two) times  daily. (Patient not taking: Reported on 12/09/2017) 20 tablet 0  . benzonatate (TESSALON) 200 MG capsule Take 1 capsule (200 mg total) by mouth 2 (two) times daily as needed for cough. (Patient not taking: Reported on 12/09/2017) 20 capsule 0   No facility-administered medications prior to visit.     No Known Allergies  ROS Review of Systems  Constitutional: Negative for appetite change, chills, fatigue and fever.  HENT: Negative for congestion, dental problem, ear pain and sore throat.   Eyes: Negative for discharge, redness and visual disturbance.  Respiratory: Negative for cough, chest tightness, shortness of breath and wheezing.   Cardiovascular: Negative for chest pain, palpitations and leg swelling.  Gastrointestinal: Negative for abdominal pain, blood in stool, diarrhea, nausea and vomiting.  Genitourinary: Negative for difficulty urinating, dysuria, flank pain, frequency, hematuria and urgency.  Musculoskeletal: Negative for arthralgias, back pain, joint swelling, myalgias and neck stiffness.  Skin: Negative for pallor and rash.  Neurological: Negative for dizziness, speech difficulty, weakness and headaches.  Hematological: Negative for adenopathy. Does not bruise/bleed easily.  Psychiatric/Behavioral: Negative for confusion and sleep disturbance. The patient is not nervous/anxious.     PE; Blood pressure 106/63, pulse 80, temperature 98.1 F (36.7 C), temperature source Oral, resp. rate 16, height 5\' 6"  (1.676 m), weight 172 lb 8 oz (78.2 kg), SpO2 96 %. Exam chaperoned by Starla Link, CMA. Gen: Alert, well appearing.  Patient is oriented to person, place, time, and situation. AFFECT: pleasant, lucid thought and speech. ENT: Ears: L EAC with mod cerumen, normal epithelium, TM normal.  R EAC with complete distal obst with cerumen.  Eyes: no injection, icteris, swelling, or exudate.  EOMI, PERRLA. Nose: no drainage or turbinate edema/swelling.  No injection or focal lesion.  Mouth:  lips without lesion/swelling.  Oral mucosa pink and moist.  Dentition intact and without obvious caries or gingival swelling.  Oropharynx without erythema, exudate, or swelling.  Neck: supple/nontender.  No LAD, mass, or TM.  Carotid pulses 2+ bilaterally, without bruits. CV: RRR, no m/r/g.   LUNGS: CTA bilat, nonlabored resps, good aeration in all lung fields. ABD: soft, NT, ND, BS normal.  No hepatospenomegaly or mass.  No bruits. EXT: no clubbing, cyanosis, or edema.  Musculoskeletal: no joint swelling, erythema, warmth, or tenderness.  ROM of all joints intact. Skin - no sores or suspicious lesions or  rashes or color changes  Pertinent labs:  Lab Results  Component Value Date   TSH 1.81 09/01/2016   Lab Results  Component Value Date   WBC 6.1 09/01/2016   HGB 13.1 09/01/2016   HCT 38.8 09/01/2016   MCV 82.1 09/01/2016   PLT 213.0 09/01/2016   Lab Results  Component Value Date   CREATININE 0.89 09/01/2016   BUN 13 09/01/2016   NA 143 09/01/2016   K 3.6 09/01/2016   CL 106 09/01/2016   CO2 30 09/01/2016   Lab Results  Component Value Date   ALT 17 09/01/2016   AST 17 09/01/2016   ALKPHOS 86 09/01/2016   BILITOT 0.6 09/01/2016   Lab Results  Component Value Date   CHOL 199 09/01/2016   Lab Results  Component Value Date   HDL 62.40 09/01/2016   Lab Results  Component Value Date   LDLCALC 102 (H) 09/01/2016   Lab Results  Component Value Date   TRIG 174.0 (H) 09/01/2016   Lab Results  Component Value Date   CHOLHDL 3 09/01/2016     ASSESSMENT AND PLAN:   1) Cerumen impaction: right,distal canal, irrigated by nurse today but no signif cerumen came out. Discussed options: ENT ref, debrox + schedule f/u here to irrigate again, or debrox and return prn for recheck. She chose to get debrox and return prn for recheck of this.  2) Health maintenance exam: Reviewed age and gender appropriate health maintenance issues (prudent diet, regular exercise, health  risks of tobacco and excessive alcohol, use of seatbelts, fire alarms in home, use of sunscreen).  Also reviewed age and gender appropriate health screening as well as vaccine recommendations. Vaccines: all UTD.  Shingrix discussed--pt to check with insurance about coverage. Labs: Fasting lipids, CMET, CBC, TSH--pt to schedule lab visit OR get these done through GYN MD. Cervical ca screening: no longer candidate for screening due to hx of hysterectomy for benign reasons. Breast ca screening: next mammo due 01/2018. Colon ca screening: colonoscopy due 04/2018 (Dr. Cristina Gong).  An After Visit Summary was printed and given to the patient.  FOLLOW UP:  Return in about 1 year (around 12/10/2018) for annual CPE (fasting).  Signed:  Crissie Sickles, MD           12/09/2017

## 2017-12-09 NOTE — Patient Instructions (Signed)

## 2017-12-15 ENCOUNTER — Other Ambulatory Visit: Payer: Self-pay | Admitting: Family Medicine

## 2017-12-15 DIAGNOSIS — M542 Cervicalgia: Secondary | ICD-10-CM

## 2017-12-15 NOTE — Telephone Encounter (Signed)
[facility] [address]  RF request for naproxen LOV: 12/09/17 Next ov: 09/06/18 Last written: 11/10/16 #30 w/ 3RF  Please advise. Thanks.

## 2018-01-12 ENCOUNTER — Encounter: Payer: Self-pay | Admitting: Family Medicine

## 2018-01-12 ENCOUNTER — Ambulatory Visit: Payer: Medicare Other | Admitting: Family Medicine

## 2018-01-12 VITALS — BP 123/76 | HR 89 | Temp 98.2°F | Resp 16 | Ht 66.0 in | Wt 172.0 lb

## 2018-01-12 DIAGNOSIS — N3000 Acute cystitis without hematuria: Secondary | ICD-10-CM | POA: Diagnosis not present

## 2018-01-12 DIAGNOSIS — R300 Dysuria: Secondary | ICD-10-CM | POA: Diagnosis not present

## 2018-01-12 DIAGNOSIS — R350 Frequency of micturition: Secondary | ICD-10-CM | POA: Diagnosis not present

## 2018-01-12 MED ORDER — SULFAMETHOXAZOLE-TRIMETHOPRIM 800-160 MG PO TABS: ORAL_TABLET | ORAL | 0 refills | Status: DC

## 2018-01-12 NOTE — Progress Notes (Signed)
OFFICE VISIT  01/12/2018   CC:  Chief Complaint  Patient presents with  . Urinary Frequency    burning, pressure and pain   HPI:    Patient is a 67 y.o. Caucasian female who presents for urinary complaints. Onset last night-->urinary urgency/frequency, suprapubic pressure, and burning with urination. No gross hematuria noted.  No nausea or fever.  No back or flank pain. Had some azo at home and took this about 5 hours ago and it helped some.  Past Medical History:  Diagnosis Date  . Depression   . GERD (gastroesophageal reflux disease)   . History of adenomatous polyp of colon   . History of hiatal hernia   . History of vitamin D deficiency    pt denies  . Osteoarthritis    Multiple joints; left THR, right TKA  . Osteoporosis 09/2014 DEXA   Worsening osteoporosis R hip.  Pt declined trial of bisphosphonate from GYN.  Repeat DEXA planned 09/2016 by GYN as per their 12/2014 office note.  . Paraesophageal hiatal hernia    Surgically repaired 09/2014  . Urinary tract infection    hx of     Past Surgical History:  Procedure Laterality Date  . COLONOSCOPY W/ POLYPECTOMY  Multiple.  Most recent 04/15/13.    2 adenomas; repeat 5 yrs (Dr. Cristina Gong)  . DEXA  12/16/2016   T-score -2.5 (improved)-Dr. McComb.  Repeat 3 yrs.  Marland Kitchen EYE SURGERY Right 11/2016   cataract   . HIATAL HERNIA REPAIR N/A 09/27/2014   Procedure: Laparoscopic takedown of large type III hiatus hernia and Nissen fundoplication over #27 dilator;  Surgeon: Pedro Earls, MD;  Location: [facility];  Service: General;  Laterality: N/A;  . PARS PLANA VITRECTOMY W/ REPAIR OF MACULAR HOLE  07/21/2016  . TOTAL HIP ARTHROPLASTY  2008   left (Dr. Noemi Chapel)  . TOTAL KNEE ARTHROPLASTY  11/2013   Right.  [facility]: Dr. Al Corpus  . VAGINAL HYSTERECTOMY  1991   fibroids (both ovaries still in)    Outpatient Medications Prior to Visit  Medication Sig Dispense Refill  . naproxen (NAPROSYN) 500 MG tablet TAKE 1 TABLET BY MOUTH TWICE A DAY  WITH MEALS 30 tablet 5   No facility-administered medications prior to visit.     No Known Allergies  ROS As per HPI  PE: Blood pressure 123/76, pulse 89, temperature 98.2 F (36.8 C), temperature source Oral, resp. rate 16, height 5\' 6"  (1.676 m), weight 172 lb (78 kg), SpO2 96 %. Gen: Alert, well appearing.  Patient is oriented to person, place, time, and situation. AFFECT: pleasant, lucid thought and speech. CV: RRR, no m/r/g.   LUNGS: CTA bilat, nonlabored resps, good aeration in all lung fields. CVA: no tenderness.  LABS:    Chemistry      Component Value Date/Time   NA 143 09/01/2016 1055   K 3.6 09/01/2016 1055   CL 106 09/01/2016 1055   CO2 30 09/01/2016 1055   BUN 13 09/01/2016 1055   CREATININE 0.89 09/01/2016 1055      Component Value Date/Time   CALCIUM 8.9 09/01/2016 1055   ALKPHOS 86 09/01/2016 1055   AST 17 09/01/2016 1055   ALT 17 09/01/2016 1055   BILITOT 0.6 09/01/2016 1055     IMPRESSION AND PLAN:  UTI: sx's c/w this dx. UA not interpretable due to recent AZO effect. Send urine for c/s. Start bactrim DS 1 bid x 3d. Cranberry juice, clear fluids, rest.  An After Visit Summary was printed  and given to the patient.  FOLLOW UP: Return if symptoms worsen or fail to improve.  Signed:  Crissie Sickles, MD           01/12/2018

## 2018-01-14 ENCOUNTER — Telehealth: Payer: Self-pay | Admitting: Family Medicine

## 2018-01-14 MED ORDER — CIPROFLOXACIN HCL 500 MG PO TABS: 500.0000 mg | ORAL_TABLET | Freq: Two times a day (BID) | ORAL | 0 refills | Status: AC

## 2018-01-14 NOTE — Telephone Encounter (Signed)
Pt advised and voiced understanding.   

## 2018-01-14 NOTE — Telephone Encounter (Signed)
Copied from [place] [id number]. Topic: Quick Communication - See Telephone Encounter >> Jan 14, 2018 11:59 AM Rutherford Nail, NT wrote: CRM for notification. See Telephone encounter for: 01/14/18. Patient states at last office visit she was diagnosed with a UTI. States she is on day 3 of medication and is not any better. Would like to know if something else can be sent to the pharmacy? CB#:[phone] [id number] [place], [place] - [address] AT [place]

## 2018-01-14 NOTE — Telephone Encounter (Signed)
OK: culture and sensitivity info is not back yet. I just sent in cipro to her pharmacy.-thx

## 2018-01-14 NOTE — Telephone Encounter (Signed)
Please advise. Thanks.  

## 2018-01-15 LAB — URINE CULTURE
MICRO NUMBER:: 90700629
SPECIMEN QUALITY:: ADEQUATE

## 2018-01-15 NOTE — Telephone Encounter (Signed)
Called pt this morning to advise of urine culture results, will wait for pt to call back.

## 2018-01-15 NOTE — Telephone Encounter (Signed)
See result note dated 01/12/18. (urine culture).

## 2018-01-15 NOTE — Telephone Encounter (Signed)
Pt is calling about the cipro. She said that she has problems with her legs and the warnings on the medication scared her. She said this is different than abx she took before. Please call to advise.

## 2018-02-09 LAB — HM MAMMOGRAPHY

## 2018-02-16 ENCOUNTER — Telehealth: Payer: Self-pay | Admitting: Family Medicine

## 2018-02-16 ENCOUNTER — Other Ambulatory Visit (INDEPENDENT_AMBULATORY_CARE_PROVIDER_SITE_OTHER): Payer: Medicare Other

## 2018-02-16 DIAGNOSIS — E663 Overweight: Secondary | ICD-10-CM | POA: Diagnosis not present

## 2018-02-16 DIAGNOSIS — Z1159 Encounter for screening for other viral diseases: Secondary | ICD-10-CM

## 2018-02-16 DIAGNOSIS — Z0000 Encounter for general adult medical examination without abnormal findings: Secondary | ICD-10-CM

## 2018-02-16 DIAGNOSIS — Z9189 Other specified personal risk factors, not elsewhere classified: Principal | ICD-10-CM

## 2018-02-16 LAB — LIPID PANEL
CHOL/HDL RATIO: 3
Cholesterol: 197 mg/dL (ref 0–200)
HDL: 57.7 mg/dL (ref >39.00)
LDL Cholesterol: 104 mg/dL — ABNORMAL HIGH (ref 0–99)
NonHDL: 139.4
Triglycerides: 179 mg/dL — ABNORMAL HIGH (ref 0.0–149.0)
VLDL: 35.8 mg/dL (ref 0.0–40.0)

## 2018-02-16 LAB — COMPREHENSIVE METABOLIC PANEL
ALK PHOS: 95 U/L (ref 39–117)
ALT: 18 U/L (ref 0–35)
AST: 17 U/L (ref 0–37)
Albumin: 4.2 g/dL (ref 3.5–5.2)
BILIRUBIN TOTAL: 0.5 mg/dL (ref 0.2–1.2)
BUN: 14 mg/dL (ref 6–23)
CO2: 30 meq/L (ref 19–32)
Calcium: 9.1 mg/dL (ref 8.4–10.5)
Chloride: 108 mEq/L (ref 96–112)
Creatinine, Ser: 0.83 mg/dL (ref 0.40–1.20)
GFR: 72.43 mL/min (ref >60.00)
Glucose, Bld: 100 mg/dL — ABNORMAL HIGH (ref 70–99)
POTASSIUM: 3.9 meq/L (ref 3.5–5.1)
Sodium: 144 mEq/L (ref 135–145)
TOTAL PROTEIN: 6.5 g/dL (ref 6.0–8.3)

## 2018-02-16 LAB — CBC WITH DIFFERENTIAL/PLATELET
BASOS ABS: 0.1 10*3/uL (ref 0.0–0.1)
Basophils Relative: 0.8 % (ref 0.0–3.0)
EOS ABS: 0.2 10*3/uL (ref 0.0–0.7)
Eosinophils Relative: 3 % (ref 0.0–5.0)
HCT: 39.9 % (ref 36.0–46.0)
Hemoglobin: 13.2 g/dL (ref 12.0–15.0)
LYMPHS ABS: 2.6 10*3/uL (ref 0.7–4.0)
Lymphocytes Relative: 37.8 % (ref 12.0–46.0)
MCHC: 33 g/dL (ref 30.0–36.0)
MCV: 83.2 fl (ref 78.0–100.0)
MONOS PCT: 5.5 % (ref 3.0–12.0)
Monocytes Absolute: 0.4 10*3/uL (ref 0.1–1.0)
NEUTROS ABS: 3.6 10*3/uL (ref 1.4–7.7)
NEUTROS PCT: 52.9 % (ref 43.0–77.0)
PLATELETS: 204 10*3/uL (ref 150.0–400.0)
RBC: 4.79 Mil/uL (ref 3.87–5.11)
RDW: 15.1 % (ref 11.5–15.5)
WBC: 6.9 10*3/uL (ref 4.0–10.5)

## 2018-02-16 LAB — TSH: TSH: 2.77 u[IU]/mL (ref 0.35–4.50)

## 2018-02-16 NOTE — Telephone Encounter (Signed)
Yes, okay to add.

## 2018-02-16 NOTE — Telephone Encounter (Signed)
Noted! Thank you

## 2018-02-16 NOTE — Telephone Encounter (Signed)
Patient presented to office today for fasting CPE labs.  Patient requesting Hep C screening test.  I drew an additional tube.  Okay to add test?  Please advise.

## 2018-02-17 LAB — HEPATITIS C ANTIBODY
Hepatitis C Ab: NONREACTIVE
SIGNAL TO CUT-OFF: 0.01 (ref <1.00)

## 2018-02-25 ENCOUNTER — Encounter: Payer: Self-pay | Admitting: *Deleted

## 2018-03-18 DIAGNOSIS — M25562 Pain in left knee: Secondary | ICD-10-CM | POA: Insufficient documentation

## 2018-07-05 ENCOUNTER — Ambulatory Visit: Payer: Self-pay

## 2018-07-05 MED ORDER — OSELTAMIVIR PHOSPHATE 75 MG PO CAPS: 75.0000 mg | ORAL_CAPSULE | Freq: Every day | ORAL | 0 refills | Status: AC

## 2018-07-05 NOTE — Addendum Note (Signed)
Addended by: Onalee Hua on: 07/05/2018 03:42 PM   Modules accepted: Orders

## 2018-07-05 NOTE — Telephone Encounter (Signed)
Pls eRx tamiflu generic 75mg , 1 tab po qd x 10d, #10, no RF for prophylaxis.-thx

## 2018-07-05 NOTE — Telephone Encounter (Signed)
Please advise. Thanks.  

## 2018-07-05 NOTE — Telephone Encounter (Signed)
Pt advised and voiced understanding.  Rx sent.  

## 2018-07-05 NOTE — Telephone Encounter (Signed)
Pt. Called to ask if Dr. Anitra Lauth recommends an anti-viral medication, since husband was diagnosed with the flu this morning.  Reported her husband started getting sick on Friday.  Pt. Stated she does not have any symptoms of illness at this time.  Informed pt. will send message to Dr. Anitra Lauth, and to expect a call back with recommendations.  Agrees with plan.    Reason for Disposition . [1] Influenza EXPOSURE within last 48 hours (2 days) AND [2] NOT HIGH RISK AND [3] strongly requests antiviral medication    Pt. Is inquiring if PCP advises anti-viral medication since husband was diagnosed with flu today; husband has been sick since Friday.  Answer Assessment - Initial Assessment Questions 1. TYPE of EXPOSURE: "How were you exposed?" (e.g., close contact, not a close contact)     Close contact  2. DATE of EXPOSURE: "When did the exposure occur?" (e.g., hour, days, weeks)     Symptoms started on Friday into Saturday  3. PREGNANCY: "Is there any chance you are pregnant?" "When was your last menstrual period?"     N/a  4. HIGH RISK for COMPLICATIONS: "Do you have any heart or lung problems? Do you have a weakened immune system?" (e.g., CHF, COPD, asthma, HIV positive, chemotherapy, renal failure, diabetes mellitus, sickle cell anemia)     Denied the above  5. SYMPTOMS: "Do you have any symptoms?" (e.g., cough, fever, sore throat, difficulty breathing).     Denied any symptoms  Protocols used: INFLUENZA EXPOSURE-A-AH

## 2018-08-04 DIAGNOSIS — E7800 Pure hypercholesterolemia, unspecified: Secondary | ICD-10-CM

## 2018-08-04 HISTORY — DX: Pure hypercholesterolemia, unspecified: E78.00

## 2018-08-19 ENCOUNTER — Ambulatory Visit: Payer: Medicare Other | Admitting: Family Medicine

## 2018-08-19 ENCOUNTER — Ambulatory Visit: Payer: Self-pay | Admitting: *Deleted

## 2018-08-19 ENCOUNTER — Encounter: Payer: Self-pay | Admitting: Family Medicine

## 2018-08-19 VITALS — BP 111/71 | HR 80 | Temp 98.0°F | Resp 16 | Ht 66.0 in | Wt 171.0 lb

## 2018-08-19 DIAGNOSIS — N3000 Acute cystitis without hematuria: Secondary | ICD-10-CM

## 2018-08-19 LAB — POCT URINALYSIS DIPSTICK
Bilirubin, UA: NEGATIVE
GLUCOSE UA: NEGATIVE
Ketones, UA: NEGATIVE
Nitrite, UA: NEGATIVE
Protein, UA: POSITIVE — AB
Spec Grav, UA: 1.02 (ref 1.010–1.025)
Urobilinogen, UA: 0.2 E.U./dL
pH, UA: 6 (ref 5.0–8.0)

## 2018-08-19 MED ORDER — SULFAMETHOXAZOLE-TRIMETHOPRIM 800-160 MG PO TABS: 1.0000 | ORAL_TABLET | Freq: Two times a day (BID) | ORAL | 0 refills | Status: DC

## 2018-08-19 NOTE — Telephone Encounter (Signed)
Pt called with complaints of pain, pressure, and burning with urination; this started the am 08/19/2018; this happens with each urination and "very little comes out"; the pt says that her urine does smell bad; recommendations made per nurse triage protocol; the pt would like to be seen in office today; per Hinton Dyer, the pt is to be at the office by 1315; pt also instructed drink lots of water prior to appointment; the pt verbalized understanding; will route to office for notification.    Reason for Disposition . Urinating more frequently than usual (i.e., frequency)  Answer Assessment - Initial Assessment Questions 1. SYMPTOM: "What's the main symptom you're concerned about?" (e.g., frequency, incontinence)     Pain, pressure and  burning with urination  2. ONSET: "When did the  start?"     08/19/2018 3. PAIN: "Is there any pain?" If so, ask: "How bad is it?" (Scale: 1-10; mild, moderate, severe)     9.5 out of 10 4. CAUSE: "What do you think is causing the symptoms?"     uti 5. OTHER SYMPTOMS: "Do you have any other symptoms?" (e.g., fever, flank pain, blood in urine, pain with urination)     Pain with urination 6. PREGNANCY: "Is there any chance you are pregnant?" "When was your last menstrual period?"     no  Protocols used: URINARY [facility]

## 2018-08-19 NOTE — Telephone Encounter (Signed)
Saw pt today in office.

## 2018-08-19 NOTE — Progress Notes (Signed)
OFFICE VISIT  08/19/2018   CC:  Chief Complaint  Patient presents with  . Urinary Tract Infection     HPI:    Patient is a 68 y.o. Caucasian female who presents for urinary complaints. Most recent UTI dx here was 7 mo ago.  Onset today: dysuria, suprapubic pressure when urinating, urinary urgency/frequency. No gross hematuria.  No nausea, no abd/flank/back pain.  No fever.  Past Medical History:  Diagnosis Date  . Depression   . GERD (gastroesophageal reflux disease)   . History of adenomatous polyp of colon   . History of hiatal hernia   . History of vitamin D deficiency    pt denies  . Osteoarthritis    Multiple joints; left THR, right TKA  . Osteoporosis 09/2014 DEXA   Worsening osteoporosis R hip.  Pt declined trial of bisphosphonate from GYN.  Repeat DEXA planned 09/2016 by GYN as per their 12/2014 office note.  . Paraesophageal hiatal hernia    Surgically repaired 09/2014  . Urinary tract infection    hx of     Past Surgical History:  Procedure Laterality Date  . COLONOSCOPY W/ POLYPECTOMY  Multiple.  Most recent 04/15/13.    2 adenomas; repeat 5 yrs (Dr. Cristina Gong)  . DEXA  12/16/2016   T-score -2.5 (improved)-Dr. McComb.  Repeat 3 yrs.  Marland Kitchen EYE SURGERY Right 11/2016   cataract   . HIATAL HERNIA REPAIR N/A 09/27/2014   Procedure: Laparoscopic takedown of large type III hiatus hernia and Nissen fundoplication over #12 dilator;  Surgeon: Pedro Earls, MD;  Location: [facility];  Service: General;  Laterality: N/A;  . PARS PLANA VITRECTOMY W/ REPAIR OF MACULAR HOLE  07/21/2016  . TOTAL HIP ARTHROPLASTY  2008   left (Dr. Noemi Chapel)  . TOTAL KNEE ARTHROPLASTY  11/2013   Right.  [facility]: Dr. Al Corpus  . VAGINAL HYSTERECTOMY  1991   fibroids (both ovaries still in)    Outpatient Medications Prior to Visit  Medication Sig Dispense Refill  . naproxen (NAPROSYN) 500 MG tablet TAKE 1 TABLET BY MOUTH TWICE A DAY WITH MEALS 30 tablet 5  . tolterodine (DETROL LA) 2 MG 24 hr capsule  Take 2 mg by mouth daily.    Marland Kitchen sulfamethoxazole-trimethoprim (BACTRIM DS,SEPTRA DS) 800-160 MG tablet 1 tab po bid x 3d (Patient not taking: Reported on 08/19/2018) 6 tablet 0   No facility-administered medications prior to visit.     No Known Allergies  ROS As per HPI  PE: Blood pressure 111/71, pulse 80, temperature 98 F (36.7 C), temperature source Oral, resp. rate 16, height 5\' 6"  (1.676 m), weight 171 lb (77.6 kg), SpO2 97 %. Gen: Alert, well appearing.  Patient is oriented to person, place, time, and situation. AFFECT: pleasant, lucid thought and speech. No further exam today.  LABS:   CC UA today: moderate LEU, small protein, o/w normal.    Chemistry      Component Value Date/Time   NA 144 02/16/2018 0813   K 3.9 02/16/2018 0813   CL 108 02/16/2018 0813   CO2 30 02/16/2018 0813   BUN 14 02/16/2018 0813   CREATININE 0.83 02/16/2018 0813      Component Value Date/Time   CALCIUM 9.1 02/16/2018 0813   ALKPHOS 95 02/16/2018 0813   AST 17 02/16/2018 0813   ALT 18 02/16/2018 0813   BILITOT 0.5 02/16/2018 0813      IMPRESSION AND PLAN:  Acute cystitis w/out hematuria. Urine sent for c/s. Started bactrim DS, 1  bid x 5d.  An After Visit Summary was printed and given to the patient.  FOLLOW UP: Return if symptoms worsen or fail to improve.  Signed:  Crissie Sickles, MD           08/19/2018

## 2018-08-22 LAB — URINE CULTURE
MICRO NUMBER:: 66377
SPECIMEN QUALITY:: ADEQUATE

## 2018-09-06 ENCOUNTER — Ambulatory Visit: Payer: Medicare Other

## 2018-09-16 HISTORY — PX: COLONOSCOPY: SHX174

## 2018-09-16 LAB — HM COLONOSCOPY

## 2018-09-20 NOTE — Progress Notes (Deleted)
Subjective:   Katelyn Cuevas is a 68 y.o. female who presents for Medicare Annual (Subsequent) preventive examination.  Review of Systems:  No ROS.  Medicare Wellness Visit. Additional risk factors are reflected in the social history.    Sleep patterns: Home Safety/Smoke Alarms: Feels safe in home. Smoke alarms in place.  Living environment; residence and Firearm Safety: Lives with husband in 1 story with basement.  Seat Belt Safety/Bike Helmet: Wears seat belt.   Female:   [id number]       Mammo-12/18/2016, benign.        Dexa scan-12/16/2016, Osteoporosis.         CCS-colonoscopy 04/15/2013, polyp.Recall 10 years     Objective:     Vitals: There were no vitals taken for this visit.  There is no height or weight on file to calculate BMI.  Advanced Directives 09/04/2017 09/01/2016 09/27/2014 09/19/2014 09/13/2014 09/03/2014  Does Patient Have a Medical Advance Directive? Yes Yes - Yes Yes No  Type of Advance Directive [place];Living will [place];Living will - Healthcare Power of [place] -  Does patient want to make changes to medical advance directive? - - No - Patient declined No - Patient declined No - Patient declined -  Copy of [place] in Chart? No - copy requested No - copy requested - Yes Yes -  Would patient like information on creating a medical advance directive? - - - - - No - patient declined information    Tobacco Social History   Tobacco Use  Smoking Status Never Smoker  Smokeless Tobacco Never Used     Counseling given: Not Answered   Clinical Intake:                       Past Medical History:  Diagnosis Date  . Depression   . GERD (gastroesophageal reflux disease)   . History of adenomatous polyp of colon   . History of hiatal hernia   . History of vitamin D deficiency    pt denies  . Osteoarthritis    Multiple joints; left THR, right TKA    . Osteoporosis 09/2014 DEXA   Worsening osteoporosis R hip.  Pt declined trial of bisphosphonate from GYN.  Repeat DEXA planned 09/2016 by GYN as per their 12/2014 office note.  . Paraesophageal hiatal hernia    Surgically repaired 09/2014  . Urinary tract infection    hx of    Past Surgical History:  Procedure Laterality Date  . COLONOSCOPY W/ POLYPECTOMY  Multiple.  Most recent 04/15/13.    2 adenomas; repeat 5 yrs (Dr. Cristina Gong)  . DEXA  12/16/2016   T-score -2.5 (improved)-Dr. McComb.  Repeat 3 yrs.  Marland Kitchen EYE SURGERY Right 11/2016   cataract   . HIATAL HERNIA REPAIR N/A 09/27/2014   Procedure: Laparoscopic takedown of large type III hiatus hernia and Nissen fundoplication over #96 dilator;  Surgeon: Pedro Earls, MD;  Location: [facility];  Service: General;  Laterality: N/A;  . PARS PLANA VITRECTOMY W/ REPAIR OF MACULAR HOLE  07/21/2016  . TOTAL HIP ARTHROPLASTY  2008   left (Dr. Noemi Chapel)  . TOTAL KNEE ARTHROPLASTY  11/2013   Right.  [facility]: Dr. Al Corpus  . VAGINAL HYSTERECTOMY  1991   fibroids (both ovaries still in)   Family History  Problem Relation Age of Onset  . Emphysema Mother   . Stroke Father   . Hypertension Father   .  Hyperlipidemia Father   . Heart disease Sister        Afib  . Retinal detachment Brother   . Cancer Sister        bladder   Social History   Socioeconomic History  . Marital status: Married    Spouse name: Not on file  . Number of children: Not on file  . Years of education: Not on file  . Highest education level: Not on file  Occupational History  . Not on file  Social Needs  . Financial resource strain: Not on file  . Food insecurity:    Worry: Not on file    Inability: Not on file  . Transportation needs:    Medical: Not on file    Non-medical: Not on file  Tobacco Use  . Smoking status: Never Smoker  . Smokeless tobacco: Never Used  Substance and Sexual Activity  . Alcohol use: No  . Drug use: No  . Sexual activity: Not on file   Lifestyle  . Physical activity:    Days per week: Not on file    Minutes per session: Not on file  . Stress: Not on file  Relationships  . Social connections:    Talks on phone: Not on file    Gets together: Not on file    Attends religious service: Not on file    Active member of club or organization: Not on file    Attends meetings of clubs or organizations: Not on file    Relationship status: Not on file  Other Topics Concern  . Not on file  Social History Narrative    Married, 2 children.   Occupation: real estate agent   No T/A/Ds.       Outpatient Encounter Medications as of 09/21/2018  Medication Sig  . naproxen (NAPROSYN) 500 MG tablet TAKE 1 TABLET BY MOUTH TWICE A DAY WITH MEALS  . sulfamethoxazole-trimethoprim (BACTRIM DS,SEPTRA DS) 800-160 MG tablet Take 1 tablet by mouth 2 (two) times daily.  Marland Kitchen tolterodine (DETROL LA) 2 MG 24 hr capsule Take 2 mg by mouth daily.   No facility-administered encounter medications on file as of 09/21/2018.     Activities of Daily Living No flowsheet data found.  Patient Care Team: Tammi Sou, MD as PCP - General (Family Medicine) Ronald Lobo, MD as Consulting Physician (Gastroenterology) Johnathan Hausen, MD as Consulting Physician (General Surgery) Determatology, [facility], MD as Referring Physician (Orthopedic Surgery) Arvella Nigh, MD as Consulting Physician (Obstetrics and Gynecology)    Assessment:   This is a routine wellness examination for Katelyn Cuevas.  Exercise Activities and Dietary recommendations   Diet (meal preparation, eat out, water intake, caffeinated beverages, dairy products, fruits and vegetables):   Breakfast: Lunch:  Dinner:      Goals    . Patient Stated     Remaining active.     . Weight (lb) < 150 lb (68 kg)     Would like to lose 17 pounds by increasing activity and decreasing sugary foods.        Fall Risk Fall Risk  09/04/2017 09/01/2016 07/05/2015 06/13/2014   Falls in the past year? No No No No    Depression Screen PHQ 2/9 Scores 09/04/2017 09/01/2016 07/05/2015 06/13/2014  PHQ - 2 Score 0 0 0 0     Cognitive Function        Immunization History  Administered Date(s) Administered  . Influenza Whole 06/26/2009  . Influenza, High Dose Seasonal  PF 05/12/2018  . Influenza,inj,Quad PF,6+ Mos 05/13/2014  . Influenza-Unspecified 05/30/2015, 05/12/2016, 06/03/2017  . Pneumococcal Conjugate-13 06/13/2014, 05/30/2015  . Pneumococcal Polysaccharide-23 05/12/2016  . Tdap 06/13/2014  . Zoster 07/27/2013     Screening Tests Health Maintenance  Topic Date Due  . MAMMOGRAM  01/02/2018  . COLONOSCOPY  04/16/2023  . TETANUS/TDAP  06/13/2024  . INFLUENZA VACCINE  Completed  . DEXA SCAN  Completed  . Hepatitis C Screening  Completed        Plan:     I have personally reviewed and noted the following in the patient's chart:   . Medical and social history . Use of alcohol, tobacco or illicit drugs  . Current medications and supplements . Functional ability and status . Nutritional status . Physical activity . Advanced directives . List of other physicians . Hospitalizations, surgeries, and ER visits in previous 12 months . Vitals . Screenings to include cognitive, depression, and falls . Referrals and appointments  In addition, I have reviewed and discussed with patient certain preventive protocols, quality metrics, and best practice recommendations. A written personalized care plan for preventive services as well as general preventive health recommendations were provided to patient.     Gerilyn Nestle, RN  09/20/2018

## 2018-09-21 ENCOUNTER — Ambulatory Visit: Payer: Medicare Other

## 2018-09-30 NOTE — Progress Notes (Signed)
Subjective:   Katelyn Cuevas is a 68 y.o. female who presents for Medicare Annual (Subsequent) preventive examination.  Review of Systems:  No ROS.  Medicare Wellness Visit. Additional risk factors are reflected in the social history.  Cardiac Risk Factors include: advanced age (>59men, >27 women);family history of premature cardiovascular disease   Sleep patterns: Sleeps 8 hours. Up to void x 1.  Home Safety/Smoke Alarms: Feels safe in home. Smoke alarms in place.  Living environment; residence and Firearm Safety: Lives with husband in 1 story with basement. Rail at steps.  Seat Belt Safety/Bike Helmet: Wears seat belt.   Female:   [id number]       Mammo-01/02/2018, negative. Dr. Radene Knee  Dexa scan-12/18/2016, Osteoporosis.    CCS-colonoscopy 09/16/2018, pt reports polyp.Recall 5 year. Dr. Cristina Gong      Objective:     Vitals: BP 110/60 (BP Location: Left Arm, Patient Position: Sitting, Cuff Size: Normal)   Pulse 79   Ht 5\' 6"  (1.676 m)   Wt 167 lb 6 oz (75.9 kg)   SpO2 97%   BMI 27.02 kg/m   Body mass index is 27.02 kg/m.  Advanced Directives 10/01/2018 09/04/2017 09/01/2016 09/27/2014 09/19/2014 09/13/2014 09/03/2014  Does Patient Have a Medical Advance Directive? Yes Yes Yes - Yes Yes No  Type of Advance Directive Living will;Healthcare Power of [place];Living will [place];Living will - Healthcare Power of [place] -  Does patient want to make changes to medical advance directive? - - - No - Patient declined No - Patient declined No - Patient declined -  Copy of [place] in Chart? No - copy requested No - copy requested No - copy requested - Yes Yes -  Would patient like information on creating a medical advance directive? - - - - - - No - patient declined information    Tobacco Social History   Tobacco Use  Smoking Status Never Smoker  Smokeless Tobacco Never Used    Counseling given: Not Answered   Past Medical History:  Diagnosis Date  . Depression   . GERD (gastroesophageal reflux disease)   . History of adenomatous polyp of colon   . History of hiatal hernia   . History of vitamin D deficiency    pt denies  . Osteoarthritis    Multiple joints; left THR, right TKA  . Osteoporosis 09/2014 DEXA   Worsening osteoporosis R hip.  Pt declined trial of bisphosphonate from GYN.  Repeat DEXA planned 09/2016 by GYN as per their 12/2014 office note.  . Paraesophageal hiatal hernia    Surgically repaired 09/2014  . Urinary tract infection    hx of    Past Surgical History:  Procedure Laterality Date  . COLONOSCOPY W/ POLYPECTOMY  Multiple.  Most recent 04/15/13.    2 adenomas; repeat 5 yrs (Dr. Cristina Gong)  . DEXA  12/16/2016   T-score -2.5 (improved)-Dr. McComb.  Repeat 3 yrs.  Marland Kitchen EYE SURGERY Right 11/2016   cataract   . HIATAL HERNIA REPAIR N/A 09/27/2014   Procedure: Laparoscopic takedown of large type III hiatus hernia and Nissen fundoplication over #92 dilator;  Surgeon: Pedro Earls, MD;  Location: [facility];  Service: General;  Laterality: N/A;  . PARS PLANA VITRECTOMY W/ REPAIR OF MACULAR HOLE  07/21/2016  . TOTAL HIP ARTHROPLASTY  2008   left (Dr. Noemi Chapel)  . TOTAL KNEE ARTHROPLASTY  11/2013   Right.  [facility]: Dr. Al Corpus  .  VAGINAL HYSTERECTOMY  1991   fibroids (both ovaries still in)   Family History  Problem Relation Age of Onset  . Emphysema Mother   . Stroke Father   . Hypertension Father   . Hyperlipidemia Father   . Heart disease Sister        Afib  . Retinal detachment Brother   . Cancer Sister        bladder   Social History   Socioeconomic History  . Marital status: Married    Spouse name: Not on file  . Number of children: Not on file  . Years of education: Not on file  . Highest education level: Not on file  Occupational History  . Not on file  Social Needs  . Financial resource strain: Not on file  . Food insecurity:     Worry: Not on file    Inability: Not on file  . Transportation needs:    Medical: Not on file    Non-medical: Not on file  Tobacco Use  . Smoking status: Never Smoker  . Smokeless tobacco: Never Used  Substance and Sexual Activity  . Alcohol use: No  . Drug use: No  . Sexual activity: Not on file  Lifestyle  . Physical activity:    Days per week: Not on file    Minutes per session: Not on file  . Stress: Not on file  Relationships  . Social connections:    Talks on phone: Not on file    Gets together: Not on file    Attends religious service: Not on file    Active member of club or organization: Not on file    Attends meetings of clubs or organizations: Not on file    Relationship status: Not on file  Other Topics Concern  . Not on file  Social History Narrative    Married, 2 children.   Occupation: real estate agent   No T/A/Ds.       Outpatient Encounter Medications as of 10/01/2018  Medication Sig  . buPROPion (WELLBUTRIN XL) 300 MG 24 hr tablet Take 300 mg by mouth daily.  . naproxen (NAPROSYN) 500 MG tablet TAKE 1 TABLET BY MOUTH TWICE A DAY WITH MEALS  . tolterodine (DETROL LA) 2 MG 24 hr capsule Take 2 mg by mouth daily.  Marland Kitchen Zoster Vaccine Adjuvanted [facility]) injection Inject 0.5 mLs into the muscle once for 1 dose.  . [DISCONTINUED] sulfamethoxazole-trimethoprim (BACTRIM DS,SEPTRA DS) 800-160 MG tablet Take 1 tablet by mouth 2 (two) times daily.   No facility-administered encounter medications on file as of 10/01/2018.     Activities of Daily Living In your present state of health, do you have any difficulty performing the following activities: 10/01/2018  Hearing? N  Vision? N  Difficulty concentrating or making decisions? N  Walking or climbing stairs? N  Dressing or bathing? N  Doing errands, shopping? N  Preparing Food and eating ? N  Using the Toilet? N  In the past six months, have you accidently leaked urine? N  Do you have problems with loss  of bowel control? N  Managing your Medications? N  Managing your Finances? N  Housekeeping or managing your Housekeeping? N  Some recent data might be hidden    Patient Care Team: Tammi Sou, MD as PCP - General (Family Medicine) Ronald Lobo, MD as Consulting Physician (Gastroenterology) Johnathan Hausen, MD as Consulting Physician (General Surgery) Determatology, [facility], MD as Referring Physician (Orthopedic Surgery) [place],  Jenny Reichmann, MD as Consulting Physician (Obstetrics and Gynecology) Keene Breath., MD (Ophthalmology)    Assessment:   This is a routine wellness examination for [place].  Exercise Activities and Dietary recommendations Current Exercise Habits: The patient does not participate in regular exercise at present(maintain house), Exercise limited by: None identified   Diet (meal preparation, eat out, water intake, caffeinated beverages, dairy products, fruits and vegetables): Drinks water and diet Coke.   Breakfast: cereal, banana; coffee Lunch: sandwich Dinner: protein and vegetables  Goals    . Patient Stated     Remaining active.     . Weight (lb) < 150 lb (68 kg)     Would like to lose 17 pounds by increasing activity and decreasing sugary foods.     . Weight (lb) < 155 lb (70.3 kg)     Lose weighty by increasing activity.        Fall Risk Fall Risk  10/01/2018 09/04/2017 09/01/2016 07/05/2015 06/13/2014  Falls in the past year? 0 No No No No     Depression Screen PHQ 2/9 Scores 10/01/2018 09/04/2017 09/01/2016 07/05/2015  PHQ - 2 Score 0 0 0 0  PHQ- 9 Score 0 - - -     Cognitive Function MMSE - Mini Mental State Exam 10/01/2018  Orientation to time 5  Orientation to Place 5  Registration 3  Attention/ Calculation 5  Recall 2  Language- name 2 objects 2  Language- repeat 1  Language- follow 3 step command 3  Language- read & follow direction 1  Write a sentence 1  Copy design 1  Total score 29         Immunization History  Administered Date(s) Administered  . Influenza Whole 06/26/2009  . Influenza, High Dose Seasonal PF 05/12/2018  . Influenza,inj,Quad PF,6+ Mos 05/13/2014  . Influenza-Unspecified 05/30/2015, 05/12/2016, 06/03/2017  . Pneumococcal Conjugate-13 06/13/2014, 05/30/2015  . Pneumococcal Polysaccharide-23 05/12/2016  . Tdap 06/13/2014  . Zoster 07/27/2013    Screening Tests Health Maintenance  Topic Date Due  . MAMMOGRAM  01/02/2018  . COLONOSCOPY  04/16/2023  . TETANUS/TDAP  06/13/2024  . INFLUENZA VACCINE  Completed  . DEXA SCAN  Completed  . Hepatitis C Screening  Completed       Plan:     Shingles vaccine at pharmacy.   Bring a copy of your living will and/or healthcare power of attorney to your next office visit.  Continue doing brain stimulating activities (puzzles, reading, adult coloring books, staying active) to keep memory sharp.   I have personally reviewed and noted the following in the patient's chart:   . Medical and social history . Use of alcohol, tobacco or illicit drugs  . Current medications and supplements . Functional ability and status . Nutritional status . Physical activity . Advanced directives . List of other physicians . Hospitalizations, surgeries, and ER visits in previous 12 months . Vitals . Screenings to include cognitive, depression, and falls . Referrals and appointments  In addition, I have reviewed and discussed with patient certain preventive protocols, quality metrics, and best practice recommendations. A written personalized care plan for preventive services as well as general preventive health recommendations were provided to patient.     Gerilyn Nestle, RN  10/01/2018   F/U with PCP 12/2018, CPE

## 2018-10-01 ENCOUNTER — Ambulatory Visit (INDEPENDENT_AMBULATORY_CARE_PROVIDER_SITE_OTHER): Payer: Medicare Other

## 2018-10-01 ENCOUNTER — Other Ambulatory Visit: Payer: Self-pay

## 2018-10-01 VITALS — BP 110/60 | HR 79 | Ht 66.0 in | Wt 167.4 lb

## 2018-10-01 DIAGNOSIS — Z23 Encounter for immunization: Secondary | ICD-10-CM

## 2018-10-01 DIAGNOSIS — Z0000 Encounter for general adult medical examination without abnormal findings: Secondary | ICD-10-CM | POA: Diagnosis not present

## 2018-10-01 MED ORDER — ZOSTER VAC RECOMB ADJUVANTED 50 MCG/0.5ML IM SUSR: 0.5000 mL | Freq: Once | INTRAMUSCULAR | 1 refills | Status: AC

## 2018-10-01 NOTE — Patient Instructions (Addendum)
Shingles vaccine at pharmacy.   Bring a copy of your living will and/or healthcare power of attorney to your next office visit.  Continue doing brain stimulating activities (puzzles, reading, adult coloring books, staying active) to keep memory sharp.   Health Maintenance, Female Adopting a healthy lifestyle and getting preventive care can go a long way to promote health and wellness. Talk with your health care provider about what schedule of regular examinations is right for you. This is a good chance for you to check in with your provider about disease prevention and staying healthy. In between checkups, there are plenty of things you can do on your own. Experts have done a lot of research about which lifestyle changes and preventive measures are most likely to keep you healthy. Ask your health care provider for more information. Weight and diet Eat a healthy diet  Be sure to include plenty of vegetables, fruits, low-fat dairy products, and lean protein.  Do not eat a lot of foods high in solid fats, added sugars, or salt.  Get regular exercise. This is one of the most important things you can do for your health. ? Most adults should exercise for at least 150 minutes each week. The exercise should increase your heart rate and make you sweat (moderate-intensity exercise). ? Most adults should also do strengthening exercises at least twice a week. This is in addition to the moderate-intensity exercise. Maintain a healthy weight  Body mass index (BMI) is a measurement that can be used to identify possible weight problems. It estimates body fat based on height and weight. Your health care provider can help determine your BMI and help you achieve or maintain a healthy weight.  For females 20 years of age and older: ? A BMI below 18.5 is considered underweight. ? A BMI of 18.5 to 24.9 is normal. ? A BMI of 25 to 29.9 is considered overweight. ? A BMI of 30 and above is considered obese. Watch  levels of cholesterol and blood lipids  You should start having your blood tested for lipids and cholesterol at 70 years of age, then have this test every 5 years.  You may need to have your cholesterol levels checked more often if: ? Your lipid or cholesterol levels are high. ? You are older than 70 years of age. ? You are at high risk for heart disease. Cancer screening Lung Cancer  Lung cancer screening is recommended for adults 55-80 years old who are at high risk for lung cancer because of a history of smoking.  A yearly low-dose CT scan of the lungs is recommended for people who: ? Currently smoke. ? Have quit within the past 15 years. ? Have at least a 30-pack-year history of smoking. A pack year is smoking an average of one pack of cigarettes a day for 1 year.  Yearly screening should continue until it has been 15 years since you quit.  Yearly screening should stop if you develop a health problem that would prevent you from having lung cancer treatment. Breast Cancer  Practice breast self-awareness. This means understanding how your breasts normally appear and feel.  It also means doing regular breast self-exams. Let your health care provider know about any changes, no matter how small.  If you are in your 20s or 30s, you should have a clinical breast exam (CBE) by a health care provider every 1-3 years as part of a regular health exam.  If you are 40 or older, have a   CBE every year. Also consider having a breast X-ray (mammogram) every year.  If you have a family history of breast cancer, talk to your health care provider about genetic screening.  If you are at high risk for breast cancer, talk to your health care provider about having an MRI and a mammogram every year.  Breast cancer gene (BRCA) assessment is recommended for women who have family members with BRCA-related cancers. BRCA-related cancers include: ? Breast. ? Ovarian. ? Tubal. ? Peritoneal  cancers.  Results of the assessment will determine the need for genetic counseling and BRCA1 and BRCA2 testing. Cervical Cancer Your health care provider may recommend that you be screened regularly for cancer of the pelvic organs (ovaries, uterus, and vagina). This screening involves a pelvic examination, including checking for microscopic changes to the surface of your cervix (Pap test). You may be encouraged to have this screening done every 3 years, beginning at age 21.  For women ages 30-65, health care providers may recommend pelvic exams and Pap testing every 3 years, or they may recommend the Pap and pelvic exam, combined with testing for human papilloma virus (HPV), every 5 years. Some types of HPV increase your risk of cervical cancer. Testing for HPV may also be done on women of any age with unclear Pap test results.  Other health care providers may not recommend any screening for nonpregnant women who are considered low risk for pelvic cancer and who do not have symptoms. Ask your health care provider if a screening pelvic exam is right for you.  If you have had past treatment for cervical cancer or a condition that could lead to cancer, you need Pap tests and screening for cancer for at least 20 years after your treatment. If Pap tests have been discontinued, your risk factors (such as having a new sexual partner) need to be reassessed to determine if screening should resume. Some women have medical problems that increase the chance of getting cervical cancer. In these cases, your health care provider may recommend more frequent screening and Pap tests. Colorectal Cancer  This type of cancer can be detected and often prevented.  Routine colorectal cancer screening usually begins at 70 years of age and continues through 70 years of age.  Your health care provider may recommend screening at an earlier age if you have risk factors for colon cancer.  Your health care provider may also  recommend using home test kits to check for hidden blood in the stool.  A small camera at the end of a tube can be used to examine your colon directly (sigmoidoscopy or colonoscopy). This is done to check for the earliest forms of colorectal cancer.  Routine screening usually begins at age 50.  Direct examination of the colon should be repeated every 5-10 years through 70 years of age. However, you may need to be screened more often if early forms of precancerous polyps or small growths are found. Skin Cancer  Check your skin from head to toe regularly.  Tell your health care provider about any new moles or changes in moles, especially if there is a change in a mole's shape or color.  Also tell your health care provider if you have a mole that is larger than the size of a pencil eraser.  Always use sunscreen. Apply sunscreen liberally and repeatedly throughout the day.  Protect yourself by wearing long sleeves, pants, a wide-brimmed hat, and sunglasses whenever you are outside. Heart disease, diabetes, and high blood   pressure  High blood pressure causes heart disease and increases the risk of stroke. High blood pressure is more likely to develop in: ? People who have blood pressure in the high end of the normal range (130-139/85-89 mm Hg). ? People who are overweight or obese. ? People who are African American.  If you are 18-39 years of age, have your blood pressure checked every 3-5 years. If you are 40 years of age or older, have your blood pressure checked every year. You should have your blood pressure measured twice-once when you are at a hospital or clinic, and once when you are not at a hospital or clinic. Record the average of the two measurements. To check your blood pressure when you are not at a hospital or clinic, you can use: ? An automated blood pressure machine at a pharmacy. ? A home blood pressure monitor.  If you are between 55 years and 79 years old, ask your health  care provider if you should take aspirin to prevent strokes.  Have regular diabetes screenings. This involves taking a blood sample to check your fasting blood sugar level. ? If you are at a normal weight and have a low risk for diabetes, have this test once every three years after 70 years of age. ? If you are overweight and have a high risk for diabetes, consider being tested at a younger age or more often. Preventing infection Hepatitis B  If you have a higher risk for hepatitis B, you should be screened for this virus. You are considered at high risk for hepatitis B if: ? You were born in a country where hepatitis B is common. Ask your health care provider which countries are considered high risk. ? Your parents were born in a high-risk country, and you have not been immunized against hepatitis B (hepatitis B vaccine). ? You have HIV or AIDS. ? You use needles to inject street drugs. ? You live with someone who has hepatitis B. ? You have had sex with someone who has hepatitis B. ? You get hemodialysis treatment. ? You take certain medicines for conditions, including cancer, organ transplantation, and autoimmune conditions. Hepatitis C  Blood testing is recommended for: ? Everyone born from 1945 through 1965. ? Anyone with known risk factors for hepatitis C. Sexually transmitted infections (STIs)  You should be screened for sexually transmitted infections (STIs) including gonorrhea and chlamydia if: ? You are sexually active and are younger than 70 years of age. ? You are older than 70 years of age and your health care provider tells you that you are at risk for this type of infection. ? Your sexual activity has changed since you were last screened and you are at an increased risk for chlamydia or gonorrhea. Ask your health care provider if you are at risk.  If you do not have HIV, but are at risk, it may be recommended that you take a prescription medicine daily to prevent HIV  infection. This is called pre-exposure prophylaxis (PrEP). You are considered at risk if: ? You are sexually active and do not regularly use condoms or know the HIV status of your partner(s). ? You take drugs by injection. ? You are sexually active with a partner who has HIV. Talk with your health care provider about whether you are at high risk of being infected with HIV. If you choose to begin PrEP, you should first be tested for HIV. You should then be tested every 3 months for   as long as you are taking PrEP. Pregnancy  If you are premenopausal and you may become pregnant, ask your health care provider about preconception counseling.  If you may become pregnant, take 400 to 800 micrograms (mcg) of folic acid every day.  If you want to prevent pregnancy, talk to your health care provider about birth control (contraception). Osteoporosis and menopause  Osteoporosis is a disease in which the bones lose minerals and strength with aging. This can result in serious bone fractures. Your risk for osteoporosis can be identified using a bone density scan.  If you are 65 years of age or older, or if you are at risk for osteoporosis and fractures, ask your health care provider if you should be screened.  Ask your health care provider whether you should take a calcium or vitamin D supplement to lower your risk for osteoporosis.  Menopause may have certain physical symptoms and risks.  Hormone replacement therapy may reduce some of these symptoms and risks. Talk to your health care provider about whether hormone replacement therapy is right for you. Follow these instructions at home:  Schedule regular health, dental, and eye exams.  Stay current with your immunizations.  Do not use any tobacco products including cigarettes, chewing tobacco, or electronic cigarettes.  If you are pregnant, do not drink alcohol.  If you are breastfeeding, limit how much and how often you drink alcohol.  Limit  alcohol intake to no more than 1 drink per day for nonpregnant women. One drink equals 12 ounces of beer, 5 ounces of wine, or 1 ounces of hard liquor.  Do not use street drugs.  Do not share needles.  Ask your health care provider for help if you need support or information about quitting drugs.  Tell your health care provider if you often feel depressed.  Tell your health care provider if you have ever been abused or do not feel safe at home. This information is not intended to replace advice given to you by your health care provider. Make sure you discuss any questions you have with your health care provider. Document Released: 02/03/2011 Document Revised: 12/27/2015 Document Reviewed: 04/24/2015 Elsevier Interactive Patient Education  2019 Elsevier Inc.  

## 2018-10-05 ENCOUNTER — Encounter: Payer: Self-pay | Admitting: Family Medicine

## 2018-10-06 NOTE — Progress Notes (Signed)
AWV reviewed and agree. Signed:  Phil McGowen, MD           10/06/2018  

## 2018-10-07 ENCOUNTER — Encounter: Payer: Self-pay | Admitting: Family Medicine

## 2018-12-31 ENCOUNTER — Encounter: Payer: Medicare Other | Admitting: Family Medicine

## 2019-03-24 LAB — HM MAMMOGRAPHY

## 2019-04-05 ENCOUNTER — Encounter: Payer: Medicare Other | Admitting: Family Medicine

## 2019-04-10 ENCOUNTER — Other Ambulatory Visit: Payer: Self-pay | Admitting: Family Medicine

## 2019-04-10 DIAGNOSIS — M542 Cervicalgia: Secondary | ICD-10-CM

## 2019-04-12 ENCOUNTER — Other Ambulatory Visit: Payer: Self-pay | Admitting: Family Medicine

## 2019-04-12 DIAGNOSIS — M542 Cervicalgia: Secondary | ICD-10-CM

## 2019-04-12 MED ORDER — NAPROXEN 500 MG PO TABS: 500.0000 mg | ORAL_TABLET | Freq: Two times a day (BID) | ORAL | 5 refills | Status: DC

## 2019-04-12 NOTE — Telephone Encounter (Signed)
RF request for Naproxen LOV: 08/19/18 Next ov: 05/25/19 Last written: 12/15/17 (30,5)  Medication pending, please advise thanks

## 2019-04-12 NOTE — Telephone Encounter (Signed)
Patient was notified regarding refill.

## 2019-04-12 NOTE — Telephone Encounter (Signed)
Patient is requesting naproxen (NAPROSYN) 500 MG tablet to be sent to [facility]. Patient scheduled her CPE.

## 2019-04-27 ENCOUNTER — Telehealth: Payer: Self-pay

## 2019-04-27 NOTE — Telephone Encounter (Signed)
Patients husband sent a mychart message on his account. Called patients husband and he put patient on the phone. Patient has had a sore throat that causes her to cough the past 2 days. She is having some postnasal drainage. Declines sinus pressure and pain as well as SOB. Patient has not had a fever. There is not a known exposure to COVID. Patient was advised to nurse symptoms and if she feels worse, SOB or fever of 102/103 or higher to go to the hospital. I offered a visit with a provider but patient would like to nurse her symptoms and go from there.

## 2019-04-29 ENCOUNTER — Other Ambulatory Visit: Payer: Self-pay

## 2019-04-29 DIAGNOSIS — Z20822 Contact with and (suspected) exposure to covid-19: Secondary | ICD-10-CM

## 2019-04-29 NOTE — Telephone Encounter (Signed)
Patients husband sent a mychart message on his account about wife.  She has not improved and he wanted to get her tested for COVID. He asked where he could get her tested without an order. I gave patient a list of places he could take her.  Patients husband said he will either take her to [facility] or to the [facility] to get her tested.  I explained to the patients husband that we will not be able to keep track of the results due to [place] not placing the order. He verbalized his understanding.

## 2019-04-30 LAB — NOVEL CORONAVIRUS, NAA: SARS-CoV-2, NAA: NOT DETECTED

## 2019-05-25 ENCOUNTER — Ambulatory Visit (INDEPENDENT_AMBULATORY_CARE_PROVIDER_SITE_OTHER): Payer: Medicare Other | Admitting: Family Medicine

## 2019-05-25 ENCOUNTER — Encounter: Payer: Self-pay | Admitting: Family Medicine

## 2019-05-25 ENCOUNTER — Other Ambulatory Visit: Payer: Self-pay

## 2019-05-25 VITALS — BP 116/81 | HR 75 | Temp 97.5°F | Resp 16 | Ht 66.0 in | Wt 171.0 lb

## 2019-05-25 DIAGNOSIS — Z0000 Encounter for general adult medical examination without abnormal findings: Secondary | ICD-10-CM | POA: Diagnosis not present

## 2019-05-25 DIAGNOSIS — E663 Overweight: Secondary | ICD-10-CM

## 2019-05-25 DIAGNOSIS — Z23 Encounter for immunization: Secondary | ICD-10-CM

## 2019-05-25 LAB — CBC WITH DIFFERENTIAL/PLATELET
Basophils Absolute: 0 10*3/uL (ref 0.0–0.1)
Basophils Relative: 0.6 % (ref 0.0–3.0)
Eosinophils Absolute: 0.2 10*3/uL (ref 0.0–0.7)
Eosinophils Relative: 3.3 % (ref 0.0–5.0)
HCT: 40.4 % (ref 36.0–46.0)
Hemoglobin: 13.3 g/dL (ref 12.0–15.0)
Lymphocytes Relative: 37.7 % (ref 12.0–46.0)
Lymphs Abs: 2.3 10*3/uL (ref 0.7–4.0)
MCHC: 33 g/dL (ref 30.0–36.0)
MCV: 83.2 fl (ref 78.0–100.0)
Monocytes Absolute: 0.4 10*3/uL (ref 0.1–1.0)
Monocytes Relative: 6.8 % (ref 3.0–12.0)
Neutro Abs: 3.1 10*3/uL (ref 1.4–7.7)
Neutrophils Relative %: 51.6 % (ref 43.0–77.0)
Platelets: 205 10*3/uL (ref 150.0–400.0)
RBC: 4.86 Mil/uL (ref 3.87–5.11)
RDW: 14.3 % (ref 11.5–15.5)
WBC: 6 10*3/uL (ref 4.0–10.5)

## 2019-05-25 LAB — COMPREHENSIVE METABOLIC PANEL
ALT: 18 U/L (ref 0–35)
AST: 18 U/L (ref 0–37)
Albumin: 4.3 g/dL (ref 3.5–5.2)
Alkaline Phosphatase: 100 U/L (ref 39–117)
BUN: 13 mg/dL (ref 6–23)
CO2: 28 mEq/L (ref 19–32)
Calcium: 9 mg/dL (ref 8.4–10.5)
Chloride: 106 mEq/L (ref 96–112)
Creatinine, Ser: 0.86 mg/dL (ref 0.40–1.20)
GFR: 65.17 mL/min (ref >60.00)
Glucose, Bld: 98 mg/dL (ref 70–99)
Potassium: 4.1 mEq/L (ref 3.5–5.1)
Sodium: 141 mEq/L (ref 135–145)
Total Bilirubin: 0.7 mg/dL (ref 0.2–1.2)
Total Protein: 6.4 g/dL (ref 6.0–8.3)

## 2019-05-25 LAB — LIPID PANEL
Cholesterol: 211 mg/dL — ABNORMAL HIGH (ref 0–200)
HDL: 57.1 mg/dL (ref >39.00)
LDL Cholesterol: 126 mg/dL — ABNORMAL HIGH (ref 0–99)
NonHDL: 154.2
Total CHOL/HDL Ratio: 4
Triglycerides: 139 mg/dL (ref 0.0–149.0)
VLDL: 27.8 mg/dL (ref 0.0–40.0)

## 2019-05-25 LAB — TSH: TSH: 1.26 u[IU]/mL (ref 0.35–4.50)

## 2019-05-25 NOTE — Progress Notes (Signed)
Office Note 05/25/2019  CC:  Chief Complaint  Patient presents with  . Annual Exam    pt is fasting    HPI:  Katelyn Cuevas is a 68 y.o. White female who is here for annual health maintenance exam. GYN MD->Dr. Arvella Nigh.  Exercise: active but no formal exercise. Diet is good. She gets annual skin ca screening via derm.  No acute complaints.  Past Medical History:  Diagnosis Date  . Depression   . GERD (gastroesophageal reflux disease)   . History of adenomatous polyp of colon   . History of hiatal hernia   . History of vitamin D deficiency    pt denies  . Osteoarthritis    Multiple joints; left THR, right TKA  . Osteoporosis 09/2014 DEXA   Worsening osteoporosis R hip.  Pt declined trial of bisphosphonate from GYN.  Repeat DEXA planned 09/2016 by GYN as per their 12/2014 office note.  . Paraesophageal hiatal hernia    Surgically repaired 09/2014  . Urinary tract infection    hx of     Past Surgical History:  Procedure Laterality Date  . COLONOSCOPY  09/16/2018   repeat 5 years, tubular adenomas  . COLONOSCOPY W/ POLYPECTOMY  Multiple.  Most recent 04/15/13.    2 adenomas; repeat 5 yrs (Dr. Cristina Gong)  . DEXA  12/16/2016   T-score -2.5 (improved)-Dr. McComb.  Repeat 3 yrs.  Marland Kitchen EYE SURGERY Right 11/2016   cataract   . HIATAL HERNIA REPAIR N/A 09/27/2014   Procedure: Laparoscopic takedown of large type III hiatus hernia and Nissen fundoplication over 0000000 dilator;  Surgeon: Pedro Earls, MD;  Location: [facility];  Service: General;  Laterality: N/A;  . PARS PLANA VITRECTOMY W/ REPAIR OF MACULAR HOLE  07/21/2016  . TOTAL HIP ARTHROPLASTY  2008   left (Dr. Noemi Chapel)  . TOTAL KNEE ARTHROPLASTY  11/2013   Right.  [facility]: Dr. Al Corpus  . VAGINAL HYSTERECTOMY  1991   fibroids (both ovaries still in)    Family History  Problem Relation Age of Onset  . Emphysema Mother   . Stroke Father   . Hypertension Father   . Hyperlipidemia Father   . Heart disease Sister         Afib  . Retinal detachment Brother   . Cancer Sister        bladder    Social History   Socioeconomic History  . Marital status: Married    Spouse name: Not on file  . Number of children: Not on file  . Years of education: Not on file  . Highest education level: Not on file  Occupational History  . Not on file  Social Needs  . Financial resource strain: Not on file  . Food insecurity    Worry: Not on file    Inability: Not on file  . Transportation needs    Medical: Not on file    Non-medical: Not on file  Tobacco Use  . Smoking status: Never Smoker  . Smokeless tobacco: Never Used  Substance and Sexual Activity  . Alcohol use: No  . Drug use: No  . Sexual activity: Not on file  Lifestyle  . Physical activity    Days per week: Not on file    Minutes per session: Not on file  . Stress: Not on file  Relationships  . Social Herbalist on phone: Not on file    Gets together: Not on file    Attends religious  service: Not on file    Active member of club or organization: Not on file    Attends meetings of clubs or organizations: Not on file    Relationship status: Not on file  . Intimate partner violence    Fear of current or ex partner: Not on file    Emotionally abused: Not on file    Physically abused: Not on file    Forced sexual activity: Not on file  Other Topics Concern  . Not on file  Social History Narrative    Married, 2 children.   Occupation: real estate agent   No T/A/Ds.       Outpatient Medications Prior to Visit  Medication Sig Dispense Refill  . buPROPion (WELLBUTRIN XL) 300 MG 24 hr tablet Take 300 mg by mouth daily.    . naproxen (NAPROSYN) 500 MG tablet Take 1 tablet (500 mg total) by mouth 2 (two) times daily with a meal. 30 tablet 5  . tolterodine (DETROL LA) 2 MG 24 hr capsule Take 2 mg by mouth daily.     No facility-administered medications prior to visit.     No Known Allergies  ROS Review of Systems   Constitutional: Negative for appetite change, chills, fatigue and fever.  HENT: Negative for congestion, dental problem, ear pain and sore throat.   Eyes: Negative for discharge, redness and visual disturbance.  Respiratory: Negative for cough, chest tightness, shortness of breath and wheezing.   Cardiovascular: Negative for chest pain, palpitations and leg swelling.  Gastrointestinal: Negative for abdominal pain, blood in stool, diarrhea, nausea and vomiting.  Genitourinary: Negative for difficulty urinating, dysuria, flank pain, frequency, hematuria and urgency.  Musculoskeletal: Negative for arthralgias, back pain, joint swelling, myalgias and neck stiffness.  Skin: Negative for pallor and rash.  Neurological: Negative for dizziness, speech difficulty, weakness and headaches.       Mild word-finding difficulty of slow onset last 1-2 yrs or so.  Hematological: Negative for adenopathy. Does not bruise/bleed easily.  Psychiatric/Behavioral: Negative for confusion and sleep disturbance. The patient is not nervous/anxious.     PE; Blood pressure 116/81, pulse 75, temperature (!) 97.5 F (36.4 C), temperature source Temporal, resp. rate 16, height 5\' 6"  (1.676 m), weight 171 lb (77.6 kg), SpO2 95 %. Body mass index is 27.6 kg/m. Exam chaperoned by Deveron Furlong, CMA.  Gen: Alert, well appearing.  Patient is oriented to person, place, time, and situation. AFFECT: pleasant, lucid thought and speech. ENT: Ears: EACs clear, normal epithelium.  TMs with good light reflex and landmarks bilaterally.  Eyes: no injection, icteris, swelling, or exudate.  EOMI, PERRLA. Nose: no drainage or turbinate edema/swelling.  No injection or focal lesion.  Mouth: lips without lesion/swelling.  Oral mucosa pink and moist.  Dentition intact and without obvious caries or gingival swelling.  Oropharynx without erythema, exudate, or swelling.  Neck: supple/nontender.  No LAD, mass, or TM.  Carotid pulses 2+  bilaterally, without bruits. CV: RRR, no m/r/g.   LUNGS: CTA bilat, nonlabored resps, good aeration in all lung fields. ABD: soft, NT, ND, BS normal.  No hepatospenomegaly or mass.  No bruits. EXT: no clubbing, cyanosis, or edema.  Musculoskeletal: no joint swelling, erythema, warmth, or tenderness.  ROM of all joints intact. Skin - no sores or suspicious lesions or rashes or color changes Neuro: CN 2-12 intact bilaterally, strength 5/5 in proximal and distal upper extremities and lower extremities bilaterally.  No sensory deficits.  No tremor.  No disdiadochokinesis.  No ataxia.  Upper extremity and lower extremity DTRs symmetric.  No pronator drift.   Pertinent labs:  Lab Results  Component Value Date   TSH 2.77 02/16/2018   Lab Results  Component Value Date   WBC 6.9 02/16/2018   HGB 13.2 02/16/2018   HCT 39.9 02/16/2018   MCV 83.2 02/16/2018   PLT 204.0 02/16/2018   Lab Results  Component Value Date   CREATININE 0.83 02/16/2018   BUN 14 02/16/2018   NA 144 02/16/2018   K 3.9 02/16/2018   CL 108 02/16/2018   CO2 30 02/16/2018   Lab Results  Component Value Date   ALT 18 02/16/2018   AST 17 02/16/2018   ALKPHOS 95 02/16/2018   BILITOT 0.5 02/16/2018   Lab Results  Component Value Date   CHOL 197 02/16/2018   Lab Results  Component Value Date   HDL 57.70 02/16/2018   Lab Results  Component Value Date   LDLCALC 104 (H) 02/16/2018   Lab Results  Component Value Date   TRIG 179.0 (H) 02/16/2018   Lab Results  Component Value Date   CHOLHDL 3 02/16/2018    ASSESSMENT AND PLAN:   Health maintenance exam: Reviewed age and gender appropriate health maintenance issues (prudent diet, regular exercise, health risks of tobacco and excessive alcohol, use of seatbelts, fire alarms in home, use of sunscreen).  Also reviewed age and gender appropriate health screening as well as vaccine recommendations. Vaccines: Flu vaccine->given today.  Shinrix->she'll check with  insurer about coverage. Labs: fasting cbc, cmet, flp,tsh. Cervical ca screening: per GYN MD->no longer a candidate for screening due to hx of hysterectomy for benign dx. Breast ca screening: per GYN MD->pt reports she has had mammogram this year (normal) at her GYN office.   She also reports a DEXA was repeated this year and it showed improved bone density. Will get GYN records. Colon ca screening: next colonoscopy due 2025.  An After Visit Summary was printed and given to the patient.  FOLLOW UP:  Return in about 1 year (around 05/24/2020) for annual CPE (fasting).  Signed:  Crissie Sickles, MD           05/25/2019

## 2019-05-25 NOTE — Patient Instructions (Addendum)
Contact your insurer to check on coverage for the vaccine called Shingrix (for shingles prevention). If you decide that you want this vaccine, call our office to request a prescription for this vaccine to be sent to your pharmacy.   Health Maintenance, Female Adopting a healthy lifestyle and getting preventive care are important in promoting health and wellness. Ask your health care provider about:  The right schedule for you to have regular tests and exams.  Things you can do on your own to prevent diseases and keep yourself healthy. What should I know about diet, weight, and exercise? Eat a healthy diet   Eat a diet that includes plenty of vegetables, fruits, low-fat dairy products, and lean protein.  Do not eat a lot of foods that are high in solid fats, added sugars, or sodium. Maintain a healthy weight Body mass index (BMI) is used to identify weight problems. It estimates body fat based on height and weight. Your health care provider can help determine your BMI and help you achieve or maintain a healthy weight. Get regular exercise Get regular exercise. This is one of the most important things you can do for your health. Most adults should:  Exercise for at least 150 minutes each week. The exercise should increase your heart rate and make you sweat (moderate-intensity exercise).  Do strengthening exercises at least twice a week. This is in addition to the moderate-intensity exercise.  Spend less time sitting. Even light physical activity can be beneficial. Watch cholesterol and blood lipids Have your blood tested for lipids and cholesterol at 70 years of age, then have this test every 5 years. Have your cholesterol levels checked more often if:  Your lipid or cholesterol levels are high.  You are older than 70 years of age.  You are at high risk for heart disease. What should I know about cancer screening? Depending on your health history and family history, you may need to  have cancer screening at various ages. This may include screening for:  Breast cancer.  Cervical cancer.  Colorectal cancer.  Skin cancer.  Lung cancer. What should I know about heart disease, diabetes, and high blood pressure? Blood pressure and heart disease  High blood pressure causes heart disease and increases the risk of stroke. This is more likely to develop in people who have high blood pressure readings, are of African descent, or are overweight.  Have your blood pressure checked: ? Every 3-5 years if you are 87-66 years of age. ? Every year if you are 28 years old or older. Diabetes Have regular diabetes screenings. This checks your fasting blood sugar level. Have the screening done:  Once every three years after age 24 if you are at a normal weight and have a low risk for diabetes.  More often and at a younger age if you are overweight or have a high risk for diabetes. What should I know about preventing infection? Hepatitis B If you have a higher risk for hepatitis B, you should be screened for this virus. Talk with your health care provider to find out if you are at risk for hepatitis B infection. Hepatitis C Testing is recommended for:  Everyone born from [birth year] through 1967.  Anyone with known risk factors for hepatitis C. Sexually transmitted infections (STIs)  Get screened for STIs, including gonorrhea and chlamydia, if: ? You are sexually active and are younger than 70 years of age. ? You are older than 70 years of age and your health  care provider tells you that you are at risk for this type of infection. ? Your sexual activity has changed since you were last screened, and you are at increased risk for chlamydia or gonorrhea. Ask your health care provider if you are at risk.  Ask your health care provider about whether you are at high risk for HIV. Your health care provider may recommend a prescription medicine to help prevent HIV infection. If you choose to  take medicine to prevent HIV, you should first get tested for HIV. You should then be tested every 3 months for as long as you are taking the medicine. Pregnancy  If you are about to stop having your period (premenopausal) and you may become pregnant, seek counseling before you get pregnant.  Take 400 to 800 micrograms (mcg) of folic acid every day if you become pregnant.  Ask for birth control (contraception) if you want to prevent pregnancy. Osteoporosis and menopause Osteoporosis is a disease in which the bones lose minerals and strength with aging. This can result in bone fractures. If you are 38 years old or older, or if you are at risk for osteoporosis and fractures, ask your health care provider if you should:  Be screened for bone loss.  Take a calcium or vitamin D supplement to lower your risk of fractures.  Be given hormone replacement therapy (HRT) to treat symptoms of menopause. Follow these instructions at home: Lifestyle  Do not use any products that contain nicotine or tobacco, such as cigarettes, e-cigarettes, and chewing tobacco. If you need help quitting, ask your health care provider.  Do not use street drugs.  Do not share needles.  Ask your health care provider for help if you need support or information about quitting drugs. Alcohol use  Do not drink alcohol if: ? Your health care provider tells you not to drink. ? You are pregnant, may be pregnant, or are planning to become pregnant.  If you drink alcohol: ? Limit how much you use to 0-1 drink a day. ? Limit intake if you are breastfeeding.  Be aware of how much alcohol is in your drink. In the [place] one drink equals one 12 oz bottle of beer (355 mL), one 5 oz glass of wine (148 mL), or one 1 oz glass of hard liquor (44 mL). General instructions  Schedule regular health, dental, and eye exams.  Stay current with your vaccines.  Tell your health care provider if: ? You often feel depressed. ? You  have ever been abused or do not feel safe at home. Summary  Adopting a healthy lifestyle and getting preventive care are important in promoting health and wellness.  Follow your health care provider's instructions about healthy diet, exercising, and getting tested or screened for diseases.  Follow your health care provider's instructions on monitoring your cholesterol and blood pressure. This information is not intended to replace advice given to you by your health care provider. Make sure you discuss any questions you have with your health care provider. Document Released: 02/03/2011 Document Revised: 07/14/2018 Document Reviewed: 07/14/2018 Elsevier Patient Education  2020 [employer].

## 2019-05-26 ENCOUNTER — Encounter: Payer: Self-pay | Admitting: Family Medicine

## 2019-05-31 ENCOUNTER — Encounter: Payer: Self-pay | Admitting: Family Medicine

## 2019-09-11 ENCOUNTER — Ambulatory Visit: Payer: Medicare Other

## 2019-09-27 ENCOUNTER — Ambulatory Visit: Payer: Medicare Other

## 2019-10-07 ENCOUNTER — Ambulatory Visit: Payer: Medicare Other

## 2020-02-23 ENCOUNTER — Other Ambulatory Visit: Payer: Self-pay

## 2020-02-23 ENCOUNTER — Encounter: Payer: Self-pay | Admitting: Family Medicine

## 2020-02-23 ENCOUNTER — Telehealth (INDEPENDENT_AMBULATORY_CARE_PROVIDER_SITE_OTHER): Payer: Medicare Other | Admitting: Family Medicine

## 2020-02-23 ENCOUNTER — Telehealth: Payer: Self-pay

## 2020-02-23 DIAGNOSIS — R21 Rash and other nonspecific skin eruption: Secondary | ICD-10-CM

## 2020-02-23 MED ORDER — TRIAMCINOLONE ACETONIDE 0.1 % EX CREA: 1.0000 "application " | TOPICAL_CREAM | Freq: Two times a day (BID) | CUTANEOUS | 0 refills | Status: DC

## 2020-02-23 NOTE — Progress Notes (Signed)
Virtual Visit via Video Note  I connected with Katelyn Cuevas  on 02/23/20 at 12:00 PM EDT by a video enabled telemedicine application and verified that I am speaking with the correct person using two identifiers.  Location patient: home, [place] Location provider:work or home office Persons participating in the virtual visit: patient, provider  I discussed the limitations of evaluation and management by telemedicine and the availability of in person appointments. The patient expressed understanding and agreed to proceed.   HPI:  Acute visit for a rash: -reports has been working in the yard a lot and got into some poison ivy about 1.5 weeks ago -developed an itchy papulovesicular rash on the bilateral arms, legs, thighs, hands -small patches in a few places, has been spreading -she has been taking antihistamine which is helping with the itch at night -denies lesions on face or near or on mucus membranes, purulence or crusting or any other symptoms    ROS: See pertinent positives and negatives per HPI.  Past Medical History:  Diagnosis Date  . Depression   . GERD (gastroesophageal reflux disease)   . History of adenomatous polyp of colon   . History of hiatal hernia   . History of vitamin D deficiency    pt denies  . Hypercholesterolemia 2020   [place] 10 yr CV risk=8.5%->gave pt option of starting med but she preferred TLC.  . Osteoarthritis    Multiple joints; left THR, right TKA  . Osteoporosis 09/2014 DEXA   Worsening osteoporosis R hip.  Pt declined trial of bisphosphonate from GYN. Rpt DEXA at GYN 2020 "improved" per pt report.  . Paraesophageal hiatal hernia    Surgically repaired 09/2014  . Urinary tract infection    hx of     Past Surgical History:  Procedure Laterality Date  . COLONOSCOPY  09/16/2018   repeat 5 years, tubular adenomas  . COLONOSCOPY W/ POLYPECTOMY  Multiple.  Most recent 04/15/13.    2 adenomas; repeat 5 yrs (Dr. Cristina Gong)  . DEXA  12/16/2016    T-score -2.5 (improved)-Dr. McComb.  Repeat 3 yrs.  [facility] Kitchen EYE SURGERY Right 11/2016   cataract   . HIATAL HERNIA REPAIR N/A 09/27/2014   Procedure: Laparoscopic takedown of large type III hiatus hernia and Nissen fundoplication over #78 dilator;  Surgeon: Pedro Earls, MD;  Location: [facility];  Service: General;  Laterality: N/A;  . PARS PLANA VITRECTOMY W/ REPAIR OF MACULAR HOLE  07/21/2016  . TOTAL HIP ARTHROPLASTY  2008   left (Dr. Noemi Chapel)  . TOTAL KNEE ARTHROPLASTY  11/2013   Right.  [facility]: Dr. Al Corpus  . VAGINAL HYSTERECTOMY  1991   fibroids (both ovaries still in)    Family History  Problem Relation Age of Onset  . Emphysema Mother   . Stroke Father   . Hypertension Father   . Hyperlipidemia Father   . Heart disease Sister        Afib  . Retinal detachment Brother   . Cancer Sister        bladder    SOCIAL HX: see hpi   Current Outpatient Medications:  .  buPROPion (WELLBUTRIN XL) 300 MG 24 hr tablet, Take 300 mg by mouth daily., Disp: , Rfl:  .  naproxen (NAPROSYN) 500 MG tablet, Take 1 tablet (500 mg total) by mouth 2 (two) times daily with a meal., Disp: 30 tablet, Rfl: 5 .  tolterodine (DETROL LA) 2 MG 24 hr capsule, Take 2 mg by mouth daily., Disp: , Rfl:  .  triamcinolone cream (KENALOG) 0.1 %, Apply 1 application topically 2 (two) times daily., Disp: 30 g, Rfl: 0  EXAM:  VITALS per patient if applicable:  GENERAL: alert, oriented, appears well and in no acute distress  HEENT: atraumatic, conjunttiva clear, no obvious abnormalities on inspection of external nose and ears  NECK: normal movements of the head and neck  LUNGS: on inspection no signs of respiratory distress, breathing rate appears normal, no obvious gross SOB, gasping or wheezing  CV: no obvious cyanosis  SKIN: on video visit exam can see a few scattered small patches of erythematous papulovesicular lesions on the arms and legs, no lesions on the face  MS: moves all visible extremities without  noticeable abnormality  PSYCH/NEURO: pleasant and cooperative, no obvious depression or anxiety, speech and thought processing grossly intact  ASSESSMENT AND PLAN:  Discussed the following assessment and plan:  Rash  -we discussed possible serious and likely etiologies, options for evaluation and workup, limitations of telemedicine visit vs in person visit, treatment, treatment risks and precautions. Pt prefers to treat via telemedicine empirically rather then risking or undertaking an in person visit at this moment. 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 No signs of secondary infection and no lesions on the face or near mucus membranes. Opted for treatment with antihistamine - allegra or zyrtec once daily and sent Rx for triam cr 0.1 % to use bid x 1 week, then qd x 1 week. Patient agrees to seek prompt in person care if worsening, spreading, lesions on the face or near mucus membranes, new symptoms arise, or if is not improving with treatment.   I discussed the assessment and treatment plan with the patient. The patient was provided an opportunity to ask questions and all were answered. The patient agreed with the plan and demonstrated an understanding of the instructions.   The patient was advised to call back or seek an in-person evaluation if the symptoms worsen or if the condition fails to improve as anticipated.   Lucretia Kern, DO

## 2020-02-23 NOTE — Telephone Encounter (Signed)
Called after hours this morning. She is covered in poison ivy all over her arms and legs. She is using some OTC meds and not helping.  She is requesting some meds or an office visit to see Dr. Anitra Lauth.  Dr. Anitra Lauth has no available appts today.  Please advise.  Patient can be reached at [phone].

## 2020-02-23 NOTE — Telephone Encounter (Signed)
PCP is not doing any work ins or add on's for today. She will have to be seen at another office or go to urgent care. Please assist with scheduling, thanks.

## 2020-02-23 NOTE — Telephone Encounter (Signed)
Patient scheduled for virtual visit with Dr. Maudie Mercury.

## 2020-03-02 ENCOUNTER — Other Ambulatory Visit: Payer: Self-pay | Admitting: Family Medicine

## 2020-03-06 ENCOUNTER — Telehealth: Payer: Self-pay | Admitting: Family Medicine

## 2020-03-06 NOTE — Telephone Encounter (Signed)
Pt is calling in stating that she needs a refill on triamcinolone cream (KENALOG) 0.1% due to the poison ivy has not cleared up as of yet and will be going out of town.   Pharm:  [place], [place]  Pt would like to have a call to let her know if this will be done by Dr. Maudie Mercury.  Pt is aware that Dr. Maudie Mercury only saw her for the acute visit and she may need to follow-up with her PCP.

## 2020-03-06 NOTE — Telephone Encounter (Signed)
Patient needs to contact her PCP regarding the request below.  Unable to leave a message due to voicemail being full.

## 2020-03-07 MED ORDER — TRIAMCINOLONE ACETONIDE 0.1 % EX CREA: 1.0000 "application " | TOPICAL_CREAM | Freq: Two times a day (BID) | CUTANEOUS | 0 refills | Status: DC

## 2020-03-07 NOTE — Telephone Encounter (Signed)
Please advise if refill appropriate, thanks.

## 2020-03-07 NOTE — Telephone Encounter (Signed)
Patient advised refill sent. 

## 2020-03-07 NOTE — Telephone Encounter (Signed)
OK, kenalog cream eRx'd

## 2020-03-07 NOTE — Telephone Encounter (Signed)
Patient is requesting refill on Kenalog due to poison ivy not cleared up completely. She is going out of town for vacation. She contacted Dr. Maudie Mercury, who originally prescribed the medication when PCP was not available, patient was scheduled a mychart virtual visit with Dr. Colin Benton at [place] on 7/22.  triamcinolone cream (KENALOG) 0.1 % [[id number]]   [id number] - [place], [place]

## 2020-05-25 ENCOUNTER — Encounter: Payer: Medicare Other | Admitting: Family Medicine

## 2020-06-08 ENCOUNTER — Encounter: Payer: Self-pay | Admitting: Family Medicine

## 2020-06-08 ENCOUNTER — Other Ambulatory Visit: Payer: Self-pay

## 2020-06-08 ENCOUNTER — Ambulatory Visit (INDEPENDENT_AMBULATORY_CARE_PROVIDER_SITE_OTHER): Payer: Medicare Other | Admitting: Family Medicine

## 2020-06-08 VITALS — BP 112/70 | HR 74 | Temp 97.9°F | Resp 16 | Ht 65.0 in | Wt 152.8 lb

## 2020-06-08 DIAGNOSIS — Z23 Encounter for immunization: Secondary | ICD-10-CM

## 2020-06-08 DIAGNOSIS — M810 Age-related osteoporosis without current pathological fracture: Secondary | ICD-10-CM

## 2020-06-08 DIAGNOSIS — Z0000 Encounter for general adult medical examination without abnormal findings: Secondary | ICD-10-CM | POA: Diagnosis not present

## 2020-06-08 DIAGNOSIS — E7800 Pure hypercholesterolemia, unspecified: Secondary | ICD-10-CM

## 2020-06-08 NOTE — Addendum Note (Signed)
Addended by: Deveron Furlong D on: 06/08/2020 01:34 PM   Modules accepted: Orders

## 2020-06-08 NOTE — Patient Instructions (Signed)
Health Maintenance, Female Adopting a healthy lifestyle and getting preventive care are important in promoting health and wellness. Ask your health care provider about:  The right schedule for you to have regular tests and exams.  Things you can do on your own to prevent diseases and keep yourself healthy. What should I know about diet, weight, and exercise? Eat a healthy diet   Eat a diet that includes plenty of vegetables, fruits, low-fat dairy products, and lean protein.  Do not eat a lot of foods that are high in solid fats, added sugars, or sodium. Maintain a healthy weight Body mass index (BMI) is used to identify weight problems. It estimates body fat based on height and weight. Your health care provider can help determine your BMI and help you achieve or maintain a healthy weight. Get regular exercise Get regular exercise. This is one of the most important things you can do for your health. Most adults should:  Exercise for at least 150 minutes each week. The exercise should increase your heart rate and make you sweat (moderate-intensity exercise).  Do strengthening exercises at least twice a week. This is in addition to the moderate-intensity exercise.  Spend less time sitting. Even light physical activity can be beneficial. Watch cholesterol and blood lipids Have your blood tested for lipids and cholesterol at 71 years of age, then have this test every 5 years. Have your cholesterol levels checked more often if:  Your lipid or cholesterol levels are high.  You are older than 71 years of age.  You are at high risk for heart disease. What should I know about cancer screening? Depending on your health history and family history, you may need to have cancer screening at various ages. This may include screening for:  Breast cancer.  Cervical cancer.  Colorectal cancer.  Skin cancer.  Lung cancer. What should I know about heart disease, diabetes, and high blood  pressure? Blood pressure and heart disease  High blood pressure causes heart disease and increases the risk of stroke. This is more likely to develop in people who have high blood pressure readings, are of African descent, or are overweight.  Have your blood pressure checked: ? Every 3-5 years if you are 18-39 years of age. ? Every year if you are 40 years old or older. Diabetes Have regular diabetes screenings. This checks your fasting blood sugar level. Have the screening done:  Once every three years after age 40 if you are at a normal weight and have a low risk for diabetes.  More often and at a younger age if you are overweight or have a high risk for diabetes. What should I know about preventing infection? Hepatitis B If you have a higher risk for hepatitis B, you should be screened for this virus. Talk with your health care provider to find out if you are at risk for hepatitis B infection. Hepatitis C Testing is recommended for:  Everyone born from 1945 through 1965.  Anyone with known risk factors for hepatitis C. Sexually transmitted infections (STIs)  Get screened for STIs, including gonorrhea and chlamydia, if: ? You are sexually active and are younger than 71 years of age. ? You are older than 71 years of age and your health care provider tells you that you are at risk for this type of infection. ? Your sexual activity has changed since you were last screened, and you are at increased risk for chlamydia or gonorrhea. Ask your health care provider if   you are at risk.  Ask your health care provider about whether you are at high risk for HIV. Your health care provider may recommend a prescription medicine to help prevent HIV infection. If you choose to take medicine to prevent HIV, you should first get tested for HIV. You should then be tested every 3 months for as long as you are taking the medicine. Pregnancy  If you are about to stop having your period (premenopausal) and  you may become pregnant, seek counseling before you get pregnant.  Take 400 to 800 micrograms (mcg) of folic acid every day if you become pregnant.  Ask for birth control (contraception) if you want to prevent pregnancy. Osteoporosis and menopause Osteoporosis is a disease in which the bones lose minerals and strength with aging. This can result in bone fractures. If you are 65 years old or older, or if you are at risk for osteoporosis and fractures, ask your health care provider if you should:  Be screened for bone loss.  Take a calcium or vitamin D supplement to lower your risk of fractures.  Be given hormone replacement therapy (HRT) to treat symptoms of menopause. Follow these instructions at home: Lifestyle  Do not use any products that contain nicotine or tobacco, such as cigarettes, e-cigarettes, and chewing tobacco. If you need help quitting, ask your health care provider.  Do not use street drugs.  Do not share needles.  Ask your health care provider for help if you need support or information about quitting drugs. Alcohol use  Do not drink alcohol if: ? Your health care provider tells you not to drink. ? You are pregnant, may be pregnant, or are planning to become pregnant.  If you drink alcohol: ? Limit how much you use to 0-1 drink a day. ? Limit intake if you are breastfeeding.  Be aware of how much alcohol is in your drink. In the [place] one drink equals one 12 oz bottle of beer (355 mL), one 5 oz glass of wine (148 mL), or one 1 oz glass of hard liquor (44 mL). General instructions  Schedule regular health, dental, and eye exams.  Stay current with your vaccines.  Tell your health care provider if: ? You often feel depressed. ? You have ever been abused or do not feel safe at home. Summary  Adopting a healthy lifestyle and getting preventive care are important in promoting health and wellness.  Follow your health care provider's instructions about healthy  diet, exercising, and getting tested or screened for diseases.  Follow your health care provider's instructions on monitoring your cholesterol and blood pressure. This information is not intended to replace advice given to you by your health care provider. Make sure you discuss any questions you have with your health care provider. Document Revised: 07/14/2018 Document Reviewed: 07/14/2018 Elsevier Patient Education  2020 [employer]  

## 2020-06-08 NOTE — Addendum Note (Signed)
Addended by: Octaviano Glow on: 06/08/2020 02:33 PM   Modules accepted: Orders

## 2020-06-08 NOTE — Progress Notes (Signed)
Office Note 06/08/2020  CC:  Chief Complaint  Patient presents with  . Annual Exam    pt is fasting. Would like flu shot today. Completed covid shots and booster as well.    HPI:  Katelyn Cuevas is a 69 y.o. White female with hx of depression who is here for annual health maintenance exam.  MDD: has been on wellbutrin for years and she feels well/stable, wants to stay on this indefinitely.  Hx of trying to wean off and didn't do well.  Has purposefully lost 18 lbs in the last year.  Intermittent fasting, staying active. No formal exercise.  Past Medical History:  Diagnosis Date  . Depression   . GERD (gastroesophageal reflux disease)   . History of adenomatous polyp of colon   . History of hiatal hernia   . History of vitamin D deficiency    pt denies  . Hypercholesterolemia 2020   [place] 10 yr CV risk=8.5%->gave pt option of starting med but she preferred TLC.  . Osteoarthritis    Multiple joints; left THR, right TKA  . Osteoporosis 09/2014 DEXA   Worsening osteoporosis R hip.  Pt declined trial of bisphosphonate from GYN. Rpt DEXA at GYN 2020 "improved" per pt report.  . Paraesophageal hiatal hernia    Surgically repaired 09/2014  . Urinary tract infection    hx of     Past Surgical History:  Procedure Laterality Date  . COLONOSCOPY  09/16/2018   repeat 5 years, tubular adenomas  . COLONOSCOPY W/ POLYPECTOMY  Multiple.  Most recent 04/15/13.    2 adenomas; repeat 5 yrs (Dr. Cristina Gong)  . DEXA  12/16/2016   T-score -2.5 (improved)-Dr. McComb.  Repeat 3 yrs.  Marland Kitchen EYE SURGERY Right 11/2016   cataract   . HIATAL HERNIA REPAIR N/A 09/27/2014   Procedure: Laparoscopic takedown of large type III hiatus hernia and Nissen fundoplication over #34 dilator;  Surgeon: Pedro Earls, MD;  Location: [facility];  Service: General;  Laterality: N/A;  . PARS PLANA VITRECTOMY W/ REPAIR OF MACULAR HOLE  07/21/2016  . TOTAL HIP ARTHROPLASTY  2008   left (Dr. Noemi Chapel)  . TOTAL  KNEE ARTHROPLASTY  11/2013   Right.  [facility]: Dr. Al Corpus  . VAGINAL HYSTERECTOMY  1991   fibroids (both ovaries still in)    Family History  Problem Relation Age of Onset  . Emphysema Mother   . Stroke Father   . Hypertension Father   . Hyperlipidemia Father   . Heart disease Sister        Afib  . Retinal detachment Brother   . Cancer Sister        bladder    Social History   Socioeconomic History  . Marital status: Married    Spouse name: Not on file  . Number of children: Not on file  . Years of education: Not on file  . Highest education level: Not on file  Occupational History  . Not on file  Tobacco Use  . Smoking status: Never Smoker  . Smokeless tobacco: Never Used  Vaping Use  . Vaping Use: Never used  Substance and Sexual Activity  . Alcohol use: No  . Drug use: No  . Sexual activity: Not on file  Other Topics Concern  . Not on file  Social History Narrative    Married, 2 children.   Occupation: real estate agent   No T/A/Ds.      Social Determinants of Health   Financial Resource Strain:   .  Difficulty of Paying Living Expenses: Not on file  Food Insecurity:   . Worried About Charity fundraiser in the Last Year: Not on file  . Ran Out of Food in the Last Year: Not on file  Transportation Needs:   . Lack of Transportation (Medical): Not on file  . Lack of Transportation (Non-Medical): Not on file  Physical Activity:   . Days of Exercise per Week: Not on file  . Minutes of Exercise per Session: Not on file  Stress:   . Feeling of Stress : Not on file  Social Connections:   . Frequency of Communication with Friends and Family: Not on file  . Frequency of Social Gatherings with Friends and Family: Not on file  . Attends Religious Services: Not on file  . Active Member of Clubs or Organizations: Not on file  . Attends Archivist Meetings: Not on file  . Marital Status: Not on file  Intimate Partner Violence:   . Fear of Current or  Ex-Partner: Not on file  . Emotionally Abused: Not on file  . Physically Abused: Not on file  . Sexually Abused: Not on file    Outpatient Medications Prior to Visit  Medication Sig Dispense Refill  . buPROPion (WELLBUTRIN XL) 300 MG 24 hr tablet Take 300 mg by mouth daily.    . naproxen (NAPROSYN) 500 MG tablet Take 1 tablet (500 mg total) by mouth 2 (two) times daily with a meal. 30 tablet 5  . triamcinolone cream (KENALOG) 0.1 % Apply 1 application topically 2 (two) times daily. 30 g 0  . tolterodine (DETROL LA) 2 MG 24 hr capsule Take 2 mg by mouth daily. (Patient not taking: Reported on 06/08/2020)     No facility-administered medications prior to visit.    No Known Allergies  ROS Review of Systems  Constitutional: Negative for appetite change, chills, fatigue and fever.  HENT: Negative for congestion, dental problem, ear pain and sore throat.   Eyes: Negative for discharge, redness and visual disturbance.  Respiratory: Negative for cough, chest tightness, shortness of breath and wheezing.   Cardiovascular: Negative for chest pain, palpitations and leg swelling.  Gastrointestinal: Negative for abdominal pain, blood in stool, diarrhea, nausea and vomiting.  Genitourinary: Negative for difficulty urinating, dysuria, flank pain, frequency, hematuria and urgency.  Musculoskeletal: Negative for arthralgias, back pain, joint swelling, myalgias and neck stiffness.  Skin: Negative for pallor and rash.  Neurological: Negative for dizziness, speech difficulty, weakness and headaches.  Hematological: Negative for adenopathy. Does not bruise/bleed easily.  Psychiatric/Behavioral: Negative for confusion and sleep disturbance. The patient is not nervous/anxious.    PE; Vitals with BMI 06/08/2020 05/25/2019 10/01/2018  Height 5\' 5"  5\' 6"  5\' 6"   Weight 152 lbs 13 oz 171 lbs 167 lbs 6 oz  BMI 25.43 37.62 83.15  Systolic 176 160 737  Diastolic 70 81 60  Pulse 74 75 79   Exam chaperoned by  Kavin Leech, CMA.  Gen: Alert, well appearing.  Patient is oriented to person, place, time, and situation. AFFECT: pleasant, lucid thought and speech. ENT: Ears: EACs clear, normal epithelium.  TMs with good light reflex and landmarks bilaterally.  Eyes: no injection, icteris, swelling, or exudate.  EOMI, PERRLA. Nose: no drainage or turbinate edema/swelling.  No injection or focal lesion.  Mouth: lips without lesion/swelling.  Oral mucosa pink and moist.  Dentition intact and without obvious caries or gingival swelling.  Oropharynx without erythema, exudate, or swelling.  Neck: supple/nontender.  No LAD, mass, or TM.  Carotid pulses 2+ bilaterally, without bruits. CV: RRR, no m/r/g.   LUNGS: CTA bilat, nonlabored resps, good aeration in all lung fields. ABD: soft, NT, ND, BS normal.  No hepatospenomegaly or mass.  No bruits. EXT: no clubbing, cyanosis, or edema.  Musculoskeletal: no joint swelling, erythema, warmth, or tenderness.  ROM of all joints intact. Skin - no sores or suspicious lesions or rashes or color changes   Pertinent labs:  Lab Results  Component Value Date   TSH 1.26 05/25/2019   Lab Results  Component Value Date   WBC 6.0 05/25/2019   HGB 13.3 05/25/2019   HCT 40.4 05/25/2019   MCV 83.2 05/25/2019   PLT 205.0 05/25/2019   Lab Results  Component Value Date   CREATININE 0.86 05/25/2019   BUN 13 05/25/2019   NA 141 05/25/2019   K 4.1 05/25/2019   CL 106 05/25/2019   CO2 28 05/25/2019   Lab Results  Component Value Date   ALT 18 05/25/2019   AST 18 05/25/2019   ALKPHOS 100 05/25/2019   BILITOT 0.7 05/25/2019   Lab Results  Component Value Date   CHOL 211 (H) 05/25/2019   Lab Results  Component Value Date   HDL 57.10 05/25/2019   Lab Results  Component Value Date   LDLCALC 126 (H) 05/25/2019   Lab Results  Component Value Date   TRIG 139.0 05/25/2019   Lab Results  Component Value Date   CHOLHDL 4 05/25/2019    ASSESSMENT AND PLAN:    Health maintenance exam: Reviewed age and gender appropriate health maintenance issues (prudent diet, regular exercise, health risks of tobacco and excessive alcohol, use of seatbelts, fire alarms in home, use of sunscreen).  Also reviewed age and gender appropriate health screening as well as vaccine recommendations. Vaccines: ALL UTD. Flu-->given today.  Shingrix->UTD. Labs: CMET, CBC, FLP ordered. Cervical ca screening: hx of hysterectomy for benign dx, followed by GYN MD, no further paps. Breast ca screening: gets annual mammogram via her GYN office, scheduled for next week. Colon ca screening: Hx of adenomas->next colonoscopy due 2025. Osteoporosis screening: had DEXA 2020 at GYN MD, per pt this showed stable bone density.  Hx of MDD, in remission.  Pt wishes to stay on wellbutrin indefinitely.  An After Visit Summary was printed and given to the patient.  FOLLOW UP:  Return in about 1 year (around 06/08/2021) for annual CPE (fasting).  Signed:  Crissie Sickles, MD           06/08/2020

## 2020-06-09 LAB — CBC WITH DIFFERENTIAL/PLATELET
Absolute Monocytes: 551 cells/uL (ref 200–950)
Basophils Absolute: 38 cells/uL (ref 0–200)
Basophils Relative: 0.4 %
Eosinophils Absolute: 105 cells/uL (ref 15–500)
Eosinophils Relative: 1.1 %
HCT: 38.5 % (ref 35.0–45.0)
Hemoglobin: 12.8 g/dL (ref 11.7–15.5)
Lymphs Abs: 2242 cells/uL (ref 850–3900)
MCH: 27.8 pg (ref 27.0–33.0)
MCHC: 33.2 g/dL (ref 32.0–36.0)
MCV: 83.7 fL (ref 80.0–100.0)
MPV: 10.1 fL (ref 7.5–12.5)
Monocytes Relative: 5.8 %
Neutro Abs: 6565 cells/uL (ref 1500–7800)
Neutrophils Relative %: 69.1 %
Platelets: 216 10*3/uL (ref 140–400)
RBC: 4.6 10*6/uL (ref 3.80–5.10)
RDW: 13.3 % (ref 11.0–15.0)
Total Lymphocyte: 23.6 %
WBC: 9.5 10*3/uL (ref 3.8–10.8)

## 2020-06-09 LAB — LIPID PANEL
Cholesterol: 229 mg/dL — ABNORMAL HIGH (ref <200)
HDL: 64 mg/dL (ref >=50)
LDL Cholesterol (Calc): 136 mg/dL (calc) — ABNORMAL HIGH
Non-HDL Cholesterol (Calc): 165 mg/dL (calc) — ABNORMAL HIGH (ref <130)
Total CHOL/HDL Ratio: 3.6 (calc) (ref <5.0)
Triglycerides: 157 mg/dL — ABNORMAL HIGH (ref <150)

## 2020-06-09 LAB — COMPREHENSIVE METABOLIC PANEL
AG Ratio: 1.8 (calc) (ref 1.0–2.5)
ALT: 15 U/L (ref 6–29)
AST: 17 U/L (ref 10–35)
Albumin: 4.4 g/dL (ref 3.6–5.1)
Alkaline phosphatase (APISO): 96 U/L (ref 37–153)
BUN/Creatinine Ratio: 15 (calc) (ref 6–22)
BUN: 16 mg/dL (ref 7–25)
CO2: 28 mmol/L (ref 20–32)
Calcium: 9.3 mg/dL (ref 8.6–10.4)
Chloride: 104 mmol/L (ref 98–110)
Creat: 1.05 mg/dL — ABNORMAL HIGH (ref 0.60–0.93)
Globulin: 2.4 g/dL (calc) (ref 1.9–3.7)
Glucose, Bld: 80 mg/dL (ref 65–99)
Potassium: 3.9 mmol/L (ref 3.5–5.3)
Sodium: 141 mmol/L (ref 135–146)
Total Bilirubin: 0.6 mg/dL (ref 0.2–1.2)
Total Protein: 6.8 g/dL (ref 6.1–8.1)

## 2020-06-11 ENCOUNTER — Encounter: Payer: Self-pay | Admitting: Family Medicine

## 2020-06-12 ENCOUNTER — Other Ambulatory Visit: Payer: Self-pay | Admitting: Family Medicine

## 2020-06-12 DIAGNOSIS — M542 Cervicalgia: Secondary | ICD-10-CM

## 2020-07-03 NOTE — Progress Notes (Signed)
Subjective:   Katelyn Cuevas is a 69 y.o. female who presents for Medicare Annual (Subsequent) preventive examination.   I connected with Azra today by telephone and verified that I am speaking with the correct person using two identifiers. Location patient: home Location provider: work Persons participating in the virtual visit: patient, Marine scientist.    I discussed the limitations, risks, security and privacy concerns of performing an evaluation and management service by telephone and the availability of in person appointments. I also discussed with the patient that there may be a patient responsible charge related to this service. The patient expressed understanding and verbally consented to this telephonic visit.    Interactive audio and video telecommunications were attempted between this provider and patient, however failed, due to patient having technical difficulties OR patient did not have access to video capability.  We continued and completed visit with audio only.  Some vital signs may be absent or patient reported.   Time Spent with patient on telephone encounter: 20 minutes  Review of Systems     Cardiac Risk Factors include: advanced age (>62men, >21 women)     Objective:    Today's Vitals   07/04/20 1031  Weight: 152 lb (68.9 kg)  Height: 5\' 5"  (1.651 m)   Body mass index is 25.29 kg/m.  Advanced Directives 07/04/2020 10/01/2018 09/04/2017 09/01/2016 09/27/2014 09/19/2014 09/13/2014  Does Patient Have a Medical Advance Directive? Yes Yes Yes Yes - Yes Yes  Type of Advance Directive [place];Living will Living will;Healthcare Power of [place];Living will [place];Living will - Healthcare Power of [place]  Does patient want to make changes to medical advance directive? - - - - No - Patient declined No - Patient declined No - Patient declined  Copy of [place] in Chart? Yes - validated most recent copy scanned in chart (See row information) No - copy requested No - copy requested No - copy requested - Yes Yes  Would patient like information on creating a medical advance directive? - - - - - - -    Current Medications (verified) Outpatient Encounter Medications as of 07/04/2020  Medication Sig  . buPROPion (WELLBUTRIN XL) 300 MG 24 hr tablet Take 300 mg by mouth daily.  . naproxen (NAPROSYN) 500 MG tablet TAKE 1 TABLET (500 MG TOTAL) BY MOUTH 2 (TWO) TIMES DAILY WITH A MEAL.  Marland Kitchen triamcinolone cream (KENALOG) 0.1 % Apply 1 application topically 2 (two) times daily. (Patient not taking: Reported on 07/04/2020)   No facility-administered encounter medications on file as of 07/04/2020.    Allergies (verified) Patient has no known allergies.   History: Past Medical History:  Diagnosis Date  . Depression   . GERD (gastroesophageal reflux disease)   . History of adenomatous polyp of colon   . History of hiatal hernia   . History of vitamin D deficiency    pt denies  . Hypercholesterolemia 2020/2021   statin recommended 2021  . Osteoarthritis    Multiple joints; left THR, right TKA  . Osteoporosis 09/2014 DEXA   Worsening osteoporosis R hip.  Pt declined trial of bisphosphonate from GYN. Rpt DEXA at GYN 2020 "improved" per pt report.  . Paraesophageal hiatal hernia    Surgically repaired 09/2014  . Urinary tract infection    hx of    Past Surgical History:  Procedure Laterality Date  . COLONOSCOPY  09/16/2018   repeat 5 years,  tubular adenomas  . COLONOSCOPY W/ POLYPECTOMY  Multiple.  Most recent 04/15/13.    2 adenomas; repeat 5 yrs (Dr. Cristina Gong)  . DEXA  12/16/2016   T-score -2.5 (improved)-Dr. McComb.  Repeat 3 yrs.  Marland Kitchen EYE SURGERY Right 11/2016   cataract   . HIATAL HERNIA REPAIR N/A 09/27/2014   Procedure: Laparoscopic takedown of large type III hiatus hernia and Nissen fundoplication over #60 dilator;  Surgeon: Pedro Earls, MD;  Location: [facility];  Service: General;  Laterality: N/A;  . PARS PLANA VITRECTOMY W/ REPAIR OF MACULAR HOLE  07/21/2016  . TOTAL HIP ARTHROPLASTY  2008   left (Dr. Noemi Chapel)  . TOTAL KNEE ARTHROPLASTY  11/2013   Right.  [facility]: Dr. Al Corpus  . VAGINAL HYSTERECTOMY  1991   fibroids (both ovaries still in)   Family History  Problem Relation Age of Onset  . Emphysema Mother   . Stroke Father   . Hypertension Father   . Hyperlipidemia Father   . Heart disease Sister        Afib  . Retinal detachment Brother   . Cancer Sister        bladder   Social History   Socioeconomic History  . Marital status: Married    Spouse name: Not on file  . Number of children: Not on file  . Years of education: Not on file  . Highest education level: Not on file  Occupational History  . Not on file  Tobacco Use  . Smoking status: Never Smoker  . Smokeless tobacco: Never Used  Vaping Use  . Vaping Use: Never used  Substance and Sexual Activity  . Alcohol use: No  . Drug use: No  . Sexual activity: Not on file  Other Topics Concern  . Not on file  Social History Narrative    Married, 2 children.   Occupation: real estate agent   No T/A/Ds.      Social Determinants of Health   Financial Resource Strain: Low Risk   . Difficulty of Paying Living Expenses: Not hard at all  Food Insecurity: No Food Insecurity  . Worried About Charity fundraiser in the Last Year: Never true  . Ran Out of Food in the Last Year: Never true  Transportation Needs: No Transportation Needs  . Lack of Transportation (Medical): No  . Lack of Transportation (Non-Medical): No  Physical Activity: Inactive  . Days of Exercise per Week: 0 days  . Minutes of Exercise per Session: 0 min  Stress: No Stress Concern Present  . Feeling of Stress : Not at all  Social Connections: Socially Integrated  . Frequency of Communication with Friends and Family: More than three times a week  . Frequency of Social Gatherings  with Friends and Family: More than three times a week  . Attends Religious Services: More than 4 times per year  . Active Member of Clubs or Organizations: Yes  . Attends Archivist Meetings: More than 4 times per year  . Marital Status: Married    Tobacco Counseling Counseling given: Not Answered   Clinical Intake:  Pre-visit preparation completed: Yes  Pain : No/denies pain     Nutritional Status: BMI 25 -29 Overweight Nutritional Risks: None Diabetes: No  How often do you need to have someone help you when you read instructions, pamphlets, or other written materials from your doctor or pharmacy?: 1 - Never What is the last grade level you completed in school?: some college  Diabetic?No  Interpreter Needed?: No  Information entered by :: Caroleen Hamman LPN   Activities of Daily Living In your present state of health, do you have any difficulty performing the following activities: 07/04/2020  Hearing? N  Vision? N  Difficulty concentrating or making decisions? N  Walking or climbing stairs? N  Dressing or bathing? N  Doing errands, shopping? N  Preparing Food and eating ? N  Using the Toilet? N  In the past six months, have you accidently leaked urine? N  Do you have problems with loss of bowel control? N  Managing your Medications? N  Managing your Finances? N  Housekeeping or managing your Housekeeping? N  Some recent data might be hidden    Patient Care Team: Tammi Sou, MD as PCP - General (Family Medicine) Ronald Lobo, MD as Consulting Physician (Gastroenterology) Johnathan Hausen, MD as Consulting Physician (General Surgery) Dermatology, [facility], MD as Referring Physician (Orthopedic Surgery) Arvella Nigh, MD as Consulting Physician (Obstetrics and Gynecology) Keene Breath., MD (Ophthalmology)  Indicate any recent Medical Services you may have received from other than [facility] providers in the past year (date  may be approximate).     Assessment:   This is a routine wellness examination for [place].  Hearing/Vision screen  Hearing Screening   125Hz  250Hz  500Hz  1000Hz  2000Hz  3000Hz  4000Hz  6000Hz  8000Hz   Right ear:           Left ear:           Comments: No issues  Vision Screening Comments: Wears glasses to read Last eye exam-2021-Dr.Wood  Dietary issues and exercise activities discussed: Current Exercise Habits: The patient does not participate in regular exercise at present (patient states she stays active around the house), Exercise limited by: None identified  Goals    . Patient Stated     Maintain current health      Depression Screen PHQ 2/9 Scores 07/04/2020 05/25/2019 10/01/2018 09/04/2017 09/01/2016 07/05/2015 06/13/2014  PHQ - 2 Score 0 0 0 0 0 0 0  PHQ- 9 Score - 0 0 - - - -    Fall Risk Fall Risk  07/04/2020 06/08/2020 10/01/2018 09/04/2017 09/01/2016  Falls in the past year? 0 0 0 No No  Number falls in past yr: 0 0 - - -  Injury with Fall? 0 0 - - -  Follow up Falls prevention discussed Falls evaluation completed - - -    Any stairs in or around the home? Yes  If so, are there any without handrails? No  Home free of loose throw rugs in walkways, pet beds, electrical cords, etc? Yes  Adequate lighting in your home to reduce risk of falls? Yes   ASSISTIVE DEVICES UTILIZED TO PREVENT FALLS:  Life alert? No  Use of a cane, walker or w/c? No  Grab bars in the bathroom? No  Shower chair or bench in shower? No  Elevated toilet seat or a handicapped toilet? No   TIMED UP AND GO:  Was the test performed? No . Phone visit   Cognitive Function:No cognitive impairment noted MMSE - Mini Mental State Exam 10/01/2018  Orientation to time 5  Orientation to Place 5  Registration 3  Attention/ Calculation 5  Recall 2  Language- name 2 objects 2  Language- repeat 1  Language- follow 3 step command 3  Language- read & follow direction 1  Write a sentence 1  Copy design 1    Total score 29  Immunizations Immunization History  Administered Date(s) Administered  . Fluad Quad(high Dose 65+) 05/25/2019, 06/08/2020  . Influenza Whole 06/26/2009  . Influenza, High Dose Seasonal PF 05/12/2018  . Influenza,inj,Quad PF,6+ Mos 05/13/2014  . Influenza-Unspecified 05/30/2015, 05/12/2016, 06/03/2017  . PFIZER SARS-COV-2 Vaccination 09/01/2019, 09/27/2019, 05/22/2020  . Pneumococcal Conjugate-13 06/13/2014, 05/30/2015  . Pneumococcal Polysaccharide-23 05/12/2016  . Tdap 06/13/2014  . Zoster 07/27/2013  . Zoster Recombinat (Shingrix) 06/07/2019, 11/15/2019    TDAP status: Up to date   Flu Vaccine status: Up to date   Pneumococcal vaccine status: Up to date   Covid-19 vaccine status: Completed vaccines  Qualifies for Shingles Vaccine? No   Zostavax completed Yes   Shingrix Completed?: Yes  Screening Tests Health Maintenance  Topic Date Due  . MAMMOGRAM  03/23/2020  . COLONOSCOPY  09/17/2023  . TETANUS/TDAP  06/13/2024  . INFLUENZA VACCINE  Completed  . DEXA SCAN  Completed  . COVID-19 Vaccine  Completed  . Hepatitis C Screening  Completed    Health Maintenance  Health Maintenance Due  Topic Date Due  . MAMMOGRAM  03/23/2020    Colorectal cancer screening: Completed Colonoscopy 09/16/2018. Repeat every 5 years   Mammogram status: Completed Bilateral 06/2020-per patient. Repeat every year  Patient to have copy of results sent to PCP.  Bone Density status: Completed 2020 per patient. Patient to have copy of results sent to PCP.  Lung Cancer Screening: (Low Dose CT Chest recommended if Age 16-80 years, 30 pack-year currently smoking OR have quit w/in 15years.) does not qualify.     Additional Screening:  Hepatitis C Screening:Completed 02/16/2018  Vision Screening: Recommended annual ophthalmology exams for early detection of glaucoma and other disorders of the eye. Is the patient up to date with their annual eye exam?  Yes  Who is  the provider or what is the name of the office in which the patient attends annual eye exams? Dr. Clydene Laming   Dental Screening: Recommended annual dental exams for proper oral hygiene  Community Resource Referral / Chronic Care Management: CRR required this visit?  No   CCM required this visit?  No      Plan:     I have personally reviewed and noted the following in the patient's chart:   . Medical and social history . Use of alcohol, tobacco or illicit drugs  . Current medications and supplements . Functional ability and status . Nutritional status . Physical activity . Advanced directives . List of other physicians . Hospitalizations, surgeries, and ER visits in previous 12 months . Vitals . Screenings to include cognitive, depression, and falls . Referrals and appointments  In addition, I have reviewed and discussed with patient certain preventive protocols, quality metrics, and best practice recommendations. A written personalized care plan for preventive services as well as general preventive health recommendations were provided to patient.   Due to this being a telephonic visit, the after visit summary with patients personalized plan was offered to patient via mail or my-chart.  Patient would like to access on my-chart.   Marta Antu, LPN   [id number]  Nurse Health Advisor  Nurse Notes: None

## 2020-07-04 ENCOUNTER — Ambulatory Visit (INDEPENDENT_AMBULATORY_CARE_PROVIDER_SITE_OTHER): Payer: Medicare Other

## 2020-07-04 VITALS — Ht 65.0 in | Wt 152.0 lb

## 2020-07-04 DIAGNOSIS — Z0000 Encounter for general adult medical examination without abnormal findings: Secondary | ICD-10-CM

## 2020-07-04 NOTE — Patient Instructions (Signed)
Katelyn Cuevas , Thank you for taking time to complete your Medicare Wellness Visit. I appreciate your ongoing commitment to your health goals. Please review the following plan we discussed and let me know if I can assist you in the future.   Screening recommendations/referrals: Colonoscopy: Completed 09/16/2018-Due-09/17/2023 Mammogram: Per our conversation, Completed 06/2020-Due 06/2021. Please have copy of results sent to Dr. Anitra Lauth. Bone Density: Per our conversation, Completed 2020-Due 2023. Please have copy of results sent to Dr. Anitra Lauth. Recommended yearly ophthalmology/optometry visit for glaucoma screening and checkup Recommended yearly dental visit for hygiene and checkup  Vaccinations: Influenza vaccine: Up to date Pneumococcal vaccine: Completed vacines Tdap vaccine: Up to date- Due-06/13/2024 Shingles vaccine: Completed vaccines  Covid-19:Completed vaccines  Advanced directives: Copy in chart  Conditions/risks identified: See problem list  Next appointment: Follow up in one year for your annual wellness visit 07/10/2021 @ 10:30am   Preventive Care 1 Years and Older, Female Preventive care refers to lifestyle choices and visits with your health care provider that can promote health and wellness. What does preventive care include?  A yearly physical exam. This is also called an annual well check.  Dental exams once or twice a year.  Routine eye exams. Ask your health care provider how often you should have your eyes checked.  Personal lifestyle choices, including:  Daily care of your teeth and gums.  Regular physical activity.  Eating a healthy diet.  Avoiding tobacco and drug use.  Limiting alcohol use.  Practicing safe sex.  Taking low-dose aspirin every day.  Taking vitamin and mineral supplements as recommended by your health care provider. What happens during an annual well check? The services and screenings done by your health care provider during  your annual well check will depend on your age, overall health, lifestyle risk factors, and family history of disease. Counseling  Your health care provider may ask you questions about your:  Alcohol use.  Tobacco use.  Drug use.  Emotional well-being.  Home and relationship well-being.  Sexual activity.  Eating habits.  History of falls.  Memory and ability to understand (cognition).  Work and work Statistician.  Reproductive health. Screening  You may have the following tests or measurements:  Height, weight, and BMI.  Blood pressure.  Lipid and cholesterol levels. These may be checked every 5 years, or more frequently if you are over 86 years old.  Skin check.  Lung cancer screening. You may have this screening every year starting at age 58 if you have a 30-pack-year history of smoking and currently smoke or have quit within the past 15 years.  Fecal occult blood test (FOBT) of the stool. You may have this test every year starting at age 22.  Flexible sigmoidoscopy or colonoscopy. You may have a sigmoidoscopy every 5 years or a colonoscopy every 10 years starting at age 46.  Hepatitis C blood test.  Hepatitis B blood test.  Sexually transmitted disease (STD) testing.  Diabetes screening. This is done by checking your blood sugar (glucose) after you have not eaten for a while (fasting). You may have this done every 1-3 years.  Bone density scan. This is done to screen for osteoporosis. You may have this done starting at age 69.  Mammogram. This may be done every 1-2 years. Talk to your health care provider about how often you should have regular mammograms. Talk with your health care provider about your test results, treatment options, and if necessary, the need for more tests. Vaccines  Your  health care provider may recommend certain vaccines, such as:  Influenza vaccine. This is recommended every year.  Tetanus, diphtheria, and acellular pertussis (Tdap,  Td) vaccine. You may need a Td booster every 10 years.  Zoster vaccine. You may need this after age 51.  Pneumococcal 13-valent conjugate (PCV13) vaccine. One dose is recommended after age 5.  Pneumococcal polysaccharide (PPSV23) vaccine. One dose is recommended after age 22. Talk to your health care provider about which screenings and vaccines you need and how often you need them. This information is not intended to replace advice given to you by your health care provider. Make sure you discuss any questions you have with your health care provider. Document Released: 08/17/2015 Document Revised: 04/09/2016 Document Reviewed: 05/22/2015 Elsevier Interactive Patient Education  2017 [place] Prevention in the Home Falls can cause injuries. They can happen to people of all ages. There are many things you can do to make your home safe and to help prevent falls. What can I do on the outside of my home?  Regularly fix the edges of walkways and driveways and fix any cracks.  Remove anything that might make you trip as you walk through a door, such as a raised step or threshold.  Trim any bushes or trees on the path to your home.  Use bright outdoor lighting.  Clear any walking paths of anything that might make someone trip, such as rocks or tools.  Regularly check to see if handrails are loose or broken. Make sure that both sides of any steps have handrails.  Any raised decks and porches should have guardrails on the edges.  Have any leaves, snow, or ice cleared regularly.  Use sand or salt on walking paths during winter.  Clean up any spills in your garage right away. This includes oil or grease spills. What can I do in the bathroom?  Use night lights.  Install grab bars by the toilet and in the tub and shower. Do not use towel bars as grab bars.  Use non-skid mats or decals in the tub or shower.  If you need to sit down in the shower, use a plastic, non-slip  stool.  Keep the floor dry. Clean up any water that spills on the floor as soon as it happens.  Remove soap buildup in the tub or shower regularly.  Attach bath mats securely with double-sided non-slip rug tape.  Do not have throw rugs and other things on the floor that can make you trip. What can I do in the bedroom?  Use night lights.  Make sure that you have a light by your bed that is easy to reach.  Do not use any sheets or blankets that are too big for your bed. They should not hang down onto the floor.  Have a firm chair that has side arms. You can use this for support while you get dressed.  Do not have throw rugs and other things on the floor that can make you trip. What can I do in the kitchen?  Clean up any spills right away.  Avoid walking on wet floors.  Keep items that you use a lot in easy-to-reach places.  If you need to reach something above you, use a strong step stool that has a grab bar.  Keep electrical cords out of the way.  Do not use floor polish or wax that makes floors slippery. If you must use wax, use non-skid floor wax.  Do not  have throw rugs and other things on the floor that can make you trip. What can I do with my stairs?  Do not leave any items on the stairs.  Make sure that there are handrails on both sides of the stairs and use them. Fix handrails that are broken or loose. Make sure that handrails are as long as the stairways.  Check any carpeting to make sure that it is firmly attached to the stairs. Fix any carpet that is loose or worn.  Avoid having throw rugs at the top or bottom of the stairs. If you do have throw rugs, attach them to the floor with carpet tape.  Make sure that you have a light switch at the top of the stairs and the bottom of the stairs. If you do not have them, ask someone to add them for you. What else can I do to help prevent falls?  Wear shoes that:  Do not have high heels.  Have rubber bottoms.  Are  comfortable and fit you well.  Are closed at the toe. Do not wear sandals.  If you use a stepladder:  Make sure that it is fully opened. Do not climb a closed stepladder.  Make sure that both sides of the stepladder are locked into place.  Ask someone to hold it for you, if possible.  Clearly mark and make sure that you can see:  Any grab bars or handrails.  First and last steps.  Where the edge of each step is.  Use tools that help you move around (mobility aids) if they are needed. These include:  Canes.  Walkers.  Scooters.  Crutches.  Turn on the lights when you go into a dark area. Replace any light bulbs as soon as they burn out.  Set up your furniture so you have a clear path. Avoid moving your furniture around.  If any of your floors are uneven, fix them.  If there are any pets around you, be aware of where they are.  Review your medicines with your doctor. Some medicines can make you feel dizzy. This can increase your chance of falling. Ask your doctor what other things that you can do to help prevent falls. This information is not intended to replace advice given to you by your health care provider. Make sure you discuss any questions you have with your health care provider. Document Released: 05/17/2009 Document Revised: 12/27/2015 Document Reviewed: 08/25/2014 Elsevier Interactive Patient Education  2017 [employer].

## 2021-02-22 ENCOUNTER — Encounter: Payer: Self-pay | Admitting: Family Medicine

## 2021-02-22 ENCOUNTER — Other Ambulatory Visit: Payer: Self-pay

## 2021-02-22 ENCOUNTER — Ambulatory Visit: Payer: Medicare Other | Admitting: Family Medicine

## 2021-02-22 VITALS — BP 132/76 | HR 81 | Temp 98.3°F | Wt 156.0 lb

## 2021-02-22 DIAGNOSIS — N3001 Acute cystitis with hematuria: Secondary | ICD-10-CM

## 2021-02-22 DIAGNOSIS — R300 Dysuria: Secondary | ICD-10-CM | POA: Diagnosis not present

## 2021-02-22 LAB — POCT URINALYSIS DIPSTICK
Bilirubin, UA: NEGATIVE
Glucose, UA: NEGATIVE
Ketones, UA: NEGATIVE
Protein, UA: NEGATIVE
Spec Grav, UA: 1.025 (ref 1.010–1.025)
Urobilinogen, UA: NEGATIVE E.U./dL — AB
pH, UA: 6 (ref 5.0–8.0)

## 2021-02-22 MED ORDER — NITROFURANTOIN MONOHYD MACRO 100 MG PO CAPS: 100.0000 mg | ORAL_CAPSULE | Freq: Two times a day (BID) | ORAL | 0 refills | Status: AC

## 2021-02-22 NOTE — Progress Notes (Signed)
Subjective:    Patient ID: Katelyn Cuevas, female    DOB: Aug 19, 1950, 70 y.o.   MRN: [id number]  Chief Complaint  Patient presents with   Urinary Tract Infection    Pressure and pain when urinating    HPI Patient was seen today for acute concern. Pt endorses dysuria, frequency, pressure, incomplete bladder emptying since yesterday.  Pt tried increasing po intake of water and fluids.  Denies fever, chills, nausea, vomiting, suprapubic pain, back pain, constipation  Past Medical History:  Diagnosis Date   Depression    GERD (gastroesophageal reflux disease)    History of adenomatous polyp of colon    History of hiatal hernia    History of vitamin D deficiency    pt denies   Hypercholesterolemia 2020/2021   statin recommended 2021   Osteoarthritis    Multiple joints; left THR, right TKA   Osteoporosis 09/2014 DEXA   Worsening osteoporosis R hip.  Pt declined trial of bisphosphonate from GYN. Rpt DEXA at GYN 2020 "improved" per pt report.   Paraesophageal hiatal hernia    Surgically repaired 09/2014   Urinary tract infection    hx of     No Known Allergies  ROS General: Denies fever, chills, night sweats, changes in weight, changes in appetite HEENT: Denies headaches, ear pain, changes in vision, rhinorrhea, sore throat CV: Denies CP, palpitations, SOB, orthopnea Pulm: Denies SOB, cough, wheezing GI: Denies abdominal pain, nausea, vomiting, diarrhea, constipation GU: Denies hematuria, vaginal discharge + dysuria, frequency, incomplete bladder emptying Msk: Denies muscle cramps, joint pains Neuro: Denies weakness, numbness, tingling Skin: Denies rashes, bruising Psych: Denies depression, anxiety, hallucinations    Objective:    Blood pressure 132/76, pulse 81, temperature 98.3 F (36.8 C), temperature source Oral, weight 156 lb (70.8 kg), SpO2 99 %.  Gen. Pleasant, well-nourished, in no distress, normal affect   HEENT: [place]/AT, face symmetric, conjunctiva clear, no  scleral icterus, PERRLA, EOMI, nares patent without drainage Lungs: no accessory muscle use Cardiovascular: RRR, no peripheral edema Neuro:  A&Ox3, CN II-XII intact, normal gait Skin:  Warm, no lesions/ rash   Wt Readings from Last 3 Encounters:  02/22/21 156 lb (70.8 kg)  07/04/20 152 lb (68.9 kg)  06/08/20 152 lb 12.8 oz (69.3 kg)    Lab Results  Component Value Date   WBC 9.5 06/08/2020   HGB 12.8 06/08/2020   HCT 38.5 06/08/2020   PLT 216 06/08/2020   GLUCOSE 80 06/08/2020   CHOL 229 (H) 06/08/2020   TRIG 157 (H) 06/08/2020   HDL 64 06/08/2020   LDLCALC 136 (H) 06/08/2020   ALT 15 06/08/2020   AST 17 06/08/2020   NA 141 06/08/2020   K 3.9 06/08/2020   CL 104 06/08/2020   CREATININE 1.05 (H) 06/08/2020   BUN 16 06/08/2020   CO2 28 06/08/2020   TSH 1.26 05/25/2019   INR 1.7 (H) 09/01/2007    Assessment/Plan:  Dysuria  - Plan: POCT urinalysis dipstick, Culture, Urine  Acute cystitis with hematuria  -UA with SG 1.025, 1+ blood, nitrites, leuks -Urine culture -Start abx.  Change ABX if needed based on culture -given handout and precautions - Plan: nitrofurantoin, macrocrystal-monohydrate, (MACROBID) 100 MG capsule, Culture, Urine  F/u as needed  Grier Mitts, MD

## 2021-02-24 LAB — URINE CULTURE
MICRO NUMBER:: 12152468
SPECIMEN QUALITY:: ADEQUATE

## 2021-03-01 ENCOUNTER — Telehealth: Payer: Self-pay | Admitting: Family Medicine

## 2021-03-08 NOTE — Telephone Encounter (Signed)
ERROR

## 2021-05-01 ENCOUNTER — Encounter: Payer: Self-pay | Admitting: Family Medicine

## 2021-05-01 MED ORDER — NIRMATRELVIR/RITONAVIR (PAXLOVID)TABLET: 3.0000 | ORAL_TABLET | Freq: Two times a day (BID) | ORAL | 0 refills | Status: AC

## 2021-05-01 NOTE — Telephone Encounter (Signed)
Patient's husband called in an asked if this can be sent in for her please advise

## 2021-05-01 NOTE — Telephone Encounter (Signed)
(  Pt RF for complications from covid-->age >65.  She is within the treatment window for oral antiviral use.  GFR 65. Will eRx'd paxlovid.)   Paxlovid eRx'd.

## 2021-06-10 ENCOUNTER — Encounter: Payer: Medicare Other | Admitting: Family Medicine

## 2021-06-10 NOTE — Patient Instructions (Incomplete)
Office Note 06/10/2021  CC: No chief complaint on file.   HPI:  Patient is a 70 y.o. female who is here for annual health maintenance exam and f/u hx of MDD.  Past Medical History:  Diagnosis Date   Depression    GERD (gastroesophageal reflux disease)    History of adenomatous polyp of colon    History of hiatal hernia    History of vitamin D deficiency    pt denies   Hypercholesterolemia 2020/2021   statin recommended 2021   Osteoarthritis    Multiple joints; left THR, right TKA   Osteoporosis 09/2014 DEXA   Worsening osteoporosis R hip.  Pt declined trial of bisphosphonate from GYN. Rpt DEXA at GYN 2020 "improved" per pt report.   Paraesophageal hiatal hernia    Surgically repaired 09/2014   Urinary tract infection    hx of     Past Surgical History:  Procedure Laterality Date   COLONOSCOPY  09/16/2018   repeat 5 years, tubular adenomas   COLONOSCOPY W/ POLYPECTOMY  Multiple.  Most recent 04/15/13.    2 adenomas; repeat 5 yrs (Dr. Cristina Gong)   DEXA  12/16/2016   T-score -2.5 (improved)-Dr. McComb.  Repeat 3 yrs.   EYE SURGERY Right 11/2016   cataract    HIATAL HERNIA REPAIR N/A 09/27/2014   Procedure: Laparoscopic takedown of large type III hiatus hernia and Nissen fundoplication over #67 dilator;  Surgeon: Pedro Earls, MD;  Location: [facility];  Service: General;  Laterality: N/A;   PARS PLANA VITRECTOMY W/ REPAIR OF MACULAR HOLE  07/21/2016   TOTAL HIP ARTHROPLASTY  2008   left (Dr. Noemi Chapel)   TOTAL KNEE ARTHROPLASTY  11/2013   Right.  [facility]: Dr. Al Corpus   VAGINAL HYSTERECTOMY  1991   fibroids (both ovaries still in)    Family History  Problem Relation Age of Onset   Emphysema Mother    Stroke Father    Hypertension Father    Hyperlipidemia Father    Heart disease Sister        Afib   Retinal detachment Brother    Cancer Sister        bladder    Social History   Socioeconomic History   Marital status: Married    Spouse name: Not on file   Number of  children: Not on file   Years of education: Not on file   Highest education level: Not on file  Occupational History   Not on file  Tobacco Use   Smoking status: Never   Smokeless tobacco: Never  Vaping Use   Vaping Use: Never used  Substance and Sexual Activity   Alcohol use: No   Drug use: No   Sexual activity: Not on file  Other Topics Concern   Not on file  Social History Narrative    Married, 2 children.   Occupation: real estate agent   No T/A/Ds.      Social Determinants of Health   Financial Resource Strain: Low Risk    Difficulty of Paying Living Expenses: Not hard at all  Food Insecurity: No Food Insecurity   Worried About Charity fundraiser in the Last Year: Never true   [place] in the Last Year: Never true  Transportation Needs: No Transportation Needs   Lack of Transportation (Medical): No   Lack of Transportation (Non-Medical): No  Physical Activity: Inactive   Days of Exercise per Week: 0 days   Minutes of Exercise per Session: 0  min  Stress: No Stress Concern Present   Feeling of Stress : Not at all  Social Connections: Socially Integrated   Frequency of Communication with Friends and Family: More than three times a week   Frequency of Social Gatherings with Friends and Family: More than three times a week   Attends Religious Services: More than 4 times per year   Active Member of [employer] or Organizations: Yes   Attends Music therapist: More than 4 times per year   Marital Status: Married  Human resources officer Violence: Not At Risk   Fear of Current or Ex-Partner: No   Emotionally Abused: No   Physically Abused: No   Sexually Abused: No    Outpatient Medications Prior to Visit  Medication Sig Dispense Refill   buPROPion (WELLBUTRIN XL) 300 MG 24 hr tablet Take 300 mg by mouth daily.     naproxen (NAPROSYN) 500 MG tablet TAKE 1 TABLET (500 MG TOTAL) BY MOUTH 2 (TWO) TIMES DAILY WITH A MEAL. 30 tablet 5   triamcinolone cream  (KENALOG) 0.1 % Apply 1 application topically 2 (two) times daily. (Patient not taking: No sig reported) 30 g 0   No facility-administered medications prior to visit.    No Known Allergies  ROS *** PE; Vitals with BMI 02/22/2021 07/04/2020 06/08/2020  Height - 5\' 5"  5\' 5"   Weight 156 lbs 152 lbs 152 lbs 13 oz  BMI - 17.61 60.73  Systolic 710 - 626  Diastolic 76 - 70  Pulse 81 - 74     *** Pertinent labs:  *** ASSESSMENT AND PLAN:   No problem-specific Assessment & Plan notes found for this encounter.   An After Visit Summary was printed and given to the patient.  FOLLOW UP:  No follow-ups on file.  Signed:  Crissie Sickles, MD           06/10/2021

## 2021-06-10 NOTE — Progress Notes (Deleted)
Office Note 06/10/2021  CC: No chief complaint on file.  HPI:  Patient is a 70 y.o. female who is here for annual health maintenance exam and f/u MDD in remission. A/P as of last visit: "Health maintenance exam: Reviewed age and gender appropriate health maintenance issues (prudent diet, regular exercise, health risks of tobacco and excessive alcohol, use of seatbelts, fire alarms in home, use of sunscreen).  Also reviewed age and gender appropriate health screening as well as vaccine recommendations. Vaccines: ALL UTD. Flu-->given today.  Shingrix->UTD. Labs: CMET, CBC, FLP ordered. Cervical ca screening: hx of hysterectomy for benign dx, followed by GYN MD, no further paps. Breast ca screening: gets annual mammogram via her GYN office, scheduled for next week. Colon ca screening: Hx of adenomas->next colonoscopy due 2025. Osteoporosis screening: had DEXA 2020 at GYN [facility], per pt this showed stable bone density.   Hx of MDD, in remission.  Pt wishes to stay on wellbutrin indefinitely."  INTERIM HX:     Past Medical History:  Diagnosis Date   Depression    GERD (gastroesophageal reflux disease)    History of adenomatous polyp of colon    History of hiatal hernia    History of vitamin D deficiency    pt denies   Hypercholesterolemia 2020/2021   statin recommended 2021   Osteoarthritis    Multiple joints; left THR, right TKA   Osteoporosis 09/2014 DEXA   Worsening osteoporosis R hip.  Pt declined trial of bisphosphonate from GYN. Rpt DEXA at GYN 2020 "improved" per pt report.   Paraesophageal hiatal hernia    Surgically repaired 09/2014   Urinary tract infection    hx of     Past Surgical History:  Procedure Laterality Date   COLONOSCOPY  09/16/2018   repeat 5 years, tubular adenomas   COLONOSCOPY W/ POLYPECTOMY  Multiple.  Most recent 04/15/13.    2 adenomas; repeat 5 yrs (Dr. Cristina Gong)   DEXA  12/16/2016   T-score -2.5 (improved)-Dr. McComb.  Repeat 3 yrs.   EYE  SURGERY Right 11/2016   cataract    HIATAL HERNIA REPAIR N/A 09/27/2014   Procedure: Laparoscopic takedown of large type III hiatus hernia and Nissen fundoplication over #41 dilator;  Surgeon: Pedro Earls, MD;  Location: [facility];  Service: General;  Laterality: N/A;   PARS PLANA VITRECTOMY W/ REPAIR OF MACULAR HOLE  07/21/2016   TOTAL HIP ARTHROPLASTY  2008   left (Dr. Noemi Chapel)   TOTAL KNEE ARTHROPLASTY  11/2013   Right.  [facility]: Dr. Al Corpus   VAGINAL HYSTERECTOMY  1991   fibroids (both ovaries still in)    Family History  Problem Relation Age of Onset   Emphysema Mother    Stroke Father    Hypertension Father    Hyperlipidemia Father    Heart disease Sister        Afib   Retinal detachment Brother    Cancer Sister        bladder    Social History   Socioeconomic History   Marital status: Married    Spouse name: Not on file   Number of children: Not on file   Years of education: Not on file   Highest education level: Not on file  Occupational History   Not on file  Tobacco Use   Smoking status: Never   Smokeless tobacco: Never  Vaping Use   Vaping Use: Never used  Substance and Sexual Activity   Alcohol use: No   Drug use: No  Sexual activity: Not on file  Other Topics Concern   Not on file  Social History Narrative    Married, 2 children.   Occupation: real estate agent   No T/A/Ds.      Social Determinants of Health   Financial Resource Strain: Low Risk    Difficulty of Paying Living Expenses: Not hard at all  Food Insecurity: No Food Insecurity   Worried About Charity fundraiser in the Last Year: Never true   [place] in the Last Year: Never true  Transportation Needs: No Transportation Needs   Lack of Transportation (Medical): No   Lack of Transportation (Non-Medical): No  Physical Activity: Inactive   Days of Exercise per Week: 0 days   Minutes of Exercise per Session: 0 min  Stress: No Stress Concern Present   Feeling of Stress : Not at  all  Social Connections: Socially Integrated   Frequency of Communication with Friends and Family: More than three times a week   Frequency of Social Gatherings with Friends and Family: More than three times a week   Attends Religious Services: More than 4 times per year   Active Member of [employer] or Organizations: Yes   Attends Music therapist: More than 4 times per year   Marital Status: Married  Human resources officer Violence: Not At Risk   Fear of Current or Ex-Partner: No   Emotionally Abused: No   Physically Abused: No   Sexually Abused: No    Outpatient Medications Prior to Visit  Medication Sig Dispense Refill   buPROPion (WELLBUTRIN XL) 300 MG 24 hr tablet Take 300 mg by mouth daily.     naproxen (NAPROSYN) 500 MG tablet TAKE 1 TABLET (500 MG TOTAL) BY MOUTH 2 (TWO) TIMES DAILY WITH A MEAL. 30 tablet 5   triamcinolone cream (KENALOG) 0.1 % Apply 1 application topically 2 (two) times daily. (Patient not taking: No sig reported) 30 g 0   No facility-administered medications prior to visit.    No Known Allergies  ROS *** PE; Vitals with BMI 02/22/2021 07/04/2020 06/08/2020  Height - 5\' 5"  5\' 5"   Weight 156 lbs 152 lbs 152 lbs 13 oz  BMI - 23.53 61.44  Systolic 315 - 400  Diastolic 76 - 70  Pulse 81 - 74     *** Pertinent labs:  Lab Results  Component Value Date   TSH 1.26 05/25/2019   Lab Results  Component Value Date   WBC 9.5 06/08/2020   HGB 12.8 06/08/2020   HCT 38.5 06/08/2020   MCV 83.7 06/08/2020   PLT 216 06/08/2020   Lab Results  Component Value Date   CREATININE 1.05 (H) 06/08/2020   BUN 16 06/08/2020   NA 141 06/08/2020   K 3.9 06/08/2020   CL 104 06/08/2020   CO2 28 06/08/2020   Lab Results  Component Value Date   ALT 15 06/08/2020   AST 17 06/08/2020   ALKPHOS 100 05/25/2019   BILITOT 0.6 06/08/2020   Lab Results  Component Value Date   CHOL 229 (H) 06/08/2020   Lab Results  Component Value Date   HDL 64 06/08/2020    Lab Results  Component Value Date   LDLCALC 136 (H) 06/08/2020   Lab Results  Component Value Date   TRIG 157 (H) 06/08/2020   Lab Results  Component Value Date   CHOLHDL 3.6 06/08/2020    ASSESSMENT AND PLAN:   Health maintenance exam: Reviewed age and gender  appropriate health maintenance issues (prudent diet, regular exercise, health risks of tobacco and excessive alcohol, use of seatbelts, fire alarms in home, use of sunscreen).  Also reviewed age and gender appropriate health screening as well as vaccine recommendations. Vaccines: ALL UTD. Flu-->given today.  Shingrix->UTD. Labs: CMET, CBC, FLP ordered. Cervical ca screening: hx of hysterectomy for benign dx, followed by GYN MD, no further paps. Breast ca screening: gets annual mammogram via her GYN office ***. Colon ca screening: Hx of adenomas->next colonoscopy due 2025. Osteoporosis screening: had DEXA 2020 at GYN MD, per pt this showed stable bone density***.   Hx of MDD, in remission.  Pt wishes to stay on wellbutrin indefinitely.  An After Visit Summary was printed and given to the patient.  FOLLOW UP:  No follow-ups on file.  Signed:  Crissie Sickles, MD           06/10/2021

## 2021-06-14 DIAGNOSIS — M8580 Other specified disorders of bone density and structure, unspecified site: Secondary | ICD-10-CM | POA: Insufficient documentation

## 2021-06-14 DIAGNOSIS — N3281 Overactive bladder: Secondary | ICD-10-CM | POA: Insufficient documentation

## 2021-06-17 LAB — HM MAMMOGRAPHY

## 2021-06-18 LAB — HM DEXA SCAN

## 2021-07-01 ENCOUNTER — Other Ambulatory Visit: Payer: Self-pay | Admitting: Family Medicine

## 2021-07-01 DIAGNOSIS — M542 Cervicalgia: Secondary | ICD-10-CM

## 2021-07-09 ENCOUNTER — Telehealth: Payer: Self-pay | Admitting: Family Medicine

## 2021-07-09 NOTE — Telephone Encounter (Signed)
LVM 07/09/21 at 10:45am to r/s AWV appt 07/10/21-khc

## 2021-07-10 ENCOUNTER — Ambulatory Visit: Payer: Medicare Other

## 2021-08-19 ENCOUNTER — Other Ambulatory Visit: Payer: Self-pay | Admitting: Family Medicine

## 2021-08-19 DIAGNOSIS — M542 Cervicalgia: Secondary | ICD-10-CM

## 2021-10-15 ENCOUNTER — Other Ambulatory Visit: Payer: Self-pay | Admitting: Family Medicine

## 2021-10-15 DIAGNOSIS — M542 Cervicalgia: Secondary | ICD-10-CM

## 2021-10-29 ENCOUNTER — Telehealth: Payer: Self-pay

## 2021-10-29 DIAGNOSIS — M542 Cervicalgia: Secondary | ICD-10-CM

## 2021-10-29 NOTE — Telephone Encounter (Signed)
Please Advise. Med pending ?

## 2021-10-29 NOTE — Addendum Note (Signed)
Addended by: Deveron Furlong D on: 10/29/2021 01:59 PM ? ? Modules accepted: Orders ? ?

## 2021-10-29 NOTE — Telephone Encounter (Signed)
RF request for naproxen ?LOV: 06/2020 ?Next ov: will schedule when back in town ?Last written: 07/01/2021 (30,0) ? ?

## 2021-10-30 ENCOUNTER — Telehealth: Payer: Self-pay | Admitting: Family Medicine

## 2021-10-30 NOTE — Telephone Encounter (Signed)
Spoke with pt regarding refill. Pt advised appt needed. CPE scheduled. ? ?

## 2021-10-30 NOTE — Telephone Encounter (Signed)
Okay for #30 no refill. ?Due for follow-up/physical prior to any further refills. ?

## 2021-11-04 MED ORDER — NAPROXEN 500 MG PO TABS: 500.0000 mg | ORAL_TABLET | Freq: Two times a day (BID) | ORAL | 0 refills | Status: DC

## 2021-11-04 NOTE — Telephone Encounter (Signed)
Pt husband called about medication: naproxen ? ?He said he was informed that medication was approved, however when he called [facility].  ?They know nothing about medication being approved or sent in. ? ?Pt said his wife really needs medication before they leave out of town, pt husband wanting to pick it up today. ? ?Pt cell phone: [phone] ? ?[id number] [place] [address] Phone:  [phone] ?Fax:  [phone] ?  ? ?

## 2021-11-04 NOTE — Telephone Encounter (Signed)
Rx sent, LM for pt to return call ?

## 2021-11-04 NOTE — Addendum Note (Signed)
Addended by: Deveron Furlong D on: 11/04/2021 10:20 AM ? ? Modules accepted: Orders ? ?

## 2021-11-05 NOTE — Telephone Encounter (Signed)
Pt advised rx sent, she picked up rx yesterday ?

## 2021-11-11 NOTE — Telephone Encounter (Signed)
SIGNED

## 2021-11-20 ENCOUNTER — Encounter: Payer: Medicare Other | Admitting: Family Medicine

## 2021-11-22 ENCOUNTER — Ambulatory Visit (INDEPENDENT_AMBULATORY_CARE_PROVIDER_SITE_OTHER): Payer: Medicare Other | Admitting: Family Medicine

## 2021-11-22 ENCOUNTER — Encounter: Payer: Self-pay | Admitting: Family Medicine

## 2021-11-22 VITALS — BP 107/68 | HR 70 | Temp 98.4°F | Ht 65.5 in | Wt 149.6 lb

## 2021-11-22 DIAGNOSIS — M542 Cervicalgia: Secondary | ICD-10-CM

## 2021-11-22 DIAGNOSIS — F3342 Major depressive disorder, recurrent, in full remission: Secondary | ICD-10-CM | POA: Diagnosis not present

## 2021-11-22 DIAGNOSIS — Z136 Encounter for screening for cardiovascular disorders: Secondary | ICD-10-CM | POA: Diagnosis not present

## 2021-11-22 DIAGNOSIS — Z0000 Encounter for general adult medical examination without abnormal findings: Secondary | ICD-10-CM | POA: Diagnosis not present

## 2021-11-22 LAB — COMPREHENSIVE METABOLIC PANEL
ALT: 15 U/L (ref 0–35)
AST: 18 U/L (ref 0–37)
Albumin: 4.2 g/dL (ref 3.5–5.2)
Alkaline Phosphatase: 90 U/L (ref 39–117)
BUN: 14 mg/dL (ref 6–23)
CO2: 29 mEq/L (ref 19–32)
Calcium: 8.8 mg/dL (ref 8.4–10.5)
Chloride: 106 mEq/L (ref 96–112)
Creatinine, Ser: 0.98 mg/dL (ref 0.40–1.20)
GFR: 57.53 mL/min — ABNORMAL LOW (ref >60.00)
Glucose, Bld: 91 mg/dL (ref 70–99)
Potassium: 4.3 mEq/L (ref 3.5–5.1)
Sodium: 142 mEq/L (ref 135–145)
Total Bilirubin: 0.6 mg/dL (ref 0.2–1.2)
Total Protein: 6.4 g/dL (ref 6.0–8.3)

## 2021-11-22 LAB — LIPID PANEL
Cholesterol: 220 mg/dL — ABNORMAL HIGH (ref 0–200)
HDL: 71.3 mg/dL (ref >39.00)
LDL Cholesterol: 129 mg/dL — ABNORMAL HIGH (ref 0–99)
NonHDL: 148.47
Total CHOL/HDL Ratio: 3
Triglycerides: 96 mg/dL (ref 0.0–149.0)
VLDL: 19.2 mg/dL (ref 0.0–40.0)

## 2021-11-22 LAB — CBC
HCT: 38.5 % (ref 36.0–46.0)
Hemoglobin: 12.6 g/dL (ref 12.0–15.0)
MCHC: 32.7 g/dL (ref 30.0–36.0)
MCV: 83.9 fl (ref 78.0–100.0)
Platelets: 200 10*3/uL (ref 150.0–400.0)
RBC: 4.59 Mil/uL (ref 3.87–5.11)
RDW: 14.3 % (ref 11.5–15.5)
WBC: 6.1 10*3/uL (ref 4.0–10.5)

## 2021-11-22 NOTE — Patient Instructions (Signed)

## 2021-11-22 NOTE — Progress Notes (Signed)
Office Note ?11/22/2021 ? ?CC:  ?Chief Complaint  ?Patient presents with  ? Annual Exam  ? ? ?HPI:  ?Patient is a 71 y.o. female who is here for annual health maintenance exam and f/u MDD. ?She is doing very well. ?Mood and anxiety levels stable long-term on Wellbutrin XL 300 mg a day. ? ?She and her husband recently went out to the grand [facility] as well as [employer]  They went snowshoe walking. ? ?Past Medical History:  ?Diagnosis Date  ? Depression   ? GERD (gastroesophageal reflux disease)   ? History of adenomatous polyp of colon   ? History of hiatal hernia   ? History of vitamin D deficiency   ? pt denies  ? Hypercholesterolemia 2020/2021  ? statin recommended 2021  ? Osteoarthritis   ? Multiple joints; left THR, right TKA  ? Osteoporosis 09/2014 DEXA  ? Worsening osteoporosis R hip.  Pt declined trial of bisphosphonate from GYN. Rpt DEXA at GYN 2020 "improved" per pt report.  ? Paraesophageal hiatal hernia   ? Surgically repaired 09/2014  ? Urinary tract infection   ? hx of   ? ? ?Past Surgical History:  ?Procedure Laterality Date  ? COLONOSCOPY  09/16/2018  ? repeat 5 years, tubular adenomas  ? COLONOSCOPY W/ POLYPECTOMY  Multiple.  Most recent 04/15/13.   ? 2 adenomas; repeat 5 yrs (Dr. Cristina Gong)  ? DEXA  12/16/2016  ? T-score -2.5 (improved)-Dr. McComb.  Repeat 3 yrs.  ? EYE SURGERY Right 11/2016  ? cataract   ? HIATAL HERNIA REPAIR N/A 09/27/2014  ? Procedure: Laparoscopic takedown of large type III hiatus hernia and Nissen fundoplication over #72 dilator;  Surgeon: Pedro Earls, MD;  Location: [facility];  Service: General;  Laterality: N/A;  ? PARS PLANA VITRECTOMY W/ REPAIR OF MACULAR HOLE  07/21/2016  ? TOTAL HIP ARTHROPLASTY  2008  ? left (Dr. Noemi Chapel)  ? TOTAL KNEE ARTHROPLASTY  11/2013  ? Right.  [facility]: Dr. Al Corpus  ? VAGINAL HYSTERECTOMY  1991  ? fibroids (both ovaries still in)  ? ? ?Family History  ?Problem Relation Age of Onset  ? Emphysema Mother   ? Stroke Father   ? Hypertension  Father   ? Hyperlipidemia Father   ? Heart disease Sister   ?     Afib  ? Retinal detachment Brother   ? Cancer Sister   ?     bladder  ? ? ?Social History  ? ?Socioeconomic History  ? Marital status: Married  ?  Spouse name: Not on file  ? Number of children: Not on file  ? Years of education: Not on file  ? Highest education level: Not on file  ?Occupational History  ? Not on file  ?Tobacco Use  ? Smoking status: Never  ? Smokeless tobacco: Never  ?Vaping Use  ? Vaping Use: Never used  ?Substance and Sexual Activity  ? Alcohol use: No  ? Drug use: No  ? Sexual activity: Not on file  ?Other Topics Concern  ? Not on file  ?Social History Narrative  ?  Married, 2 children.  ? Occupation: real estate agent  ? No T/A/Ds.  ?   ? ?Social Determinants of Health  ? ?Financial Resource Strain: Not on file  ?Food Insecurity: Not on file  ?Transportation Needs: Not on file  ?Physical Activity: Not on file  ?Stress: Not on file  ?Social Connections: Not on file  ?Intimate Partner Violence: Not on file  ? ? ?  Outpatient Medications Prior to Visit  ?Medication Sig Dispense Refill  ? buPROPion (WELLBUTRIN XL) 300 MG 24 hr tablet Take 300 mg by mouth daily.    ? naproxen (NAPROSYN) 500 MG tablet Take 1 tablet (500 mg total) by mouth 2 (two) times daily with a meal. 60 tablet 0  ? triamcinolone cream (KENALOG) 0.1 % Apply 1 application topically 2 (two) times daily. (Patient not taking: Reported on 07/04/2020) 30 g 0  ? ?No facility-administered medications prior to visit.  ? ? ?No Known Allergies ? ?ROS ?Review of Systems  ?Constitutional:  Negative for appetite change, chills, fatigue and fever.  ?HENT:  Negative for congestion, dental problem, ear pain and sore throat.   ?Eyes:  Negative for discharge, redness and visual disturbance.  ?Respiratory:  Negative for cough, chest tightness, shortness of breath and wheezing.   ?Cardiovascular:  Negative for chest pain, palpitations and leg swelling.  ?Gastrointestinal:  Negative for  abdominal pain, blood in stool, diarrhea, nausea and vomiting.  ?Genitourinary:  Negative for difficulty urinating, dysuria, flank pain, frequency, hematuria and urgency.  ?Musculoskeletal:  Negative for arthralgias, back pain, joint swelling, myalgias and neck stiffness.  ?Skin:  Negative for pallor and rash.  ?Neurological:  Negative for dizziness, speech difficulty, weakness and headaches.  ?Hematological:  Negative for adenopathy. Does not bruise/bleed easily.  ?Psychiatric/Behavioral:  Negative for confusion and sleep disturbance. The patient is not nervous/anxious.   ? ?PE; ? ?  11/22/2021  ?  9:51 AM 02/22/2021  ?  2:51 PM 07/04/2020  ? 10:31 AM  ?Vitals with BMI  ?Height 5' 5.5"  '5\' 5"'$   ?Weight 149 lbs 10 oz 156 lbs 152 lbs  ?BMI 24.51  25.29  ?Systolic 509 326   ?Diastolic 68 76   ?Pulse 70 81   ? ?Exam chaperoned by Deveron Furlong, CMA. ?Gen: Alert, well appearing.  Patient is oriented to person, place, time, and situation. ?AFFECT: pleasant, lucid thought and speech. ?ENT: Ears: EACs clear, normal epithelium.  TMs with good light reflex and landmarks bilaterally.  Eyes: no injection, icteris, swelling, or exudate.  EOMI, PERRLA. ?Nose: no drainage or turbinate edema/swelling.  No injection or focal lesion.  Mouth: lips without lesion/swelling.  Oral mucosa pink and moist.  Dentition intact and without obvious caries or gingival swelling.  Oropharynx without erythema, exudate, or swelling.  ?Neck: supple/nontender.  No LAD, mass, or TM.  Carotid pulses 2+ bilaterally, without bruits. ?CV: RRR, no m/r/g.   ?LUNGS: CTA bilat, nonlabored resps, good aeration in all lung fields. ?ABD: soft, NT, ND, BS normal.  No hepatospenomegaly or mass.  No bruits. ?EXT: no clubbing, cyanosis, or edema.  ?Musculoskeletal: no joint swelling, erythema, warmth, or tenderness.  ROM of all joints intact. ?Skin - no sores or suspicious lesions or rashes or color changes ? ?Pertinent labs:  ?Lab Results  ?Component Value Date  ?  TSH 1.26 05/25/2019  ? ?Lab Results  ?Component Value Date  ? WBC 9.5 06/08/2020  ? HGB 12.8 06/08/2020  ? HCT 38.5 06/08/2020  ? MCV 83.7 06/08/2020  ? PLT 216 06/08/2020  ? ?Lab Results  ?Component Value Date  ? CREATININE 1.05 (H) 06/08/2020  ? BUN 16 06/08/2020  ? NA 141 06/08/2020  ? K 3.9 06/08/2020  ? CL 104 06/08/2020  ? CO2 28 06/08/2020  ? ?Lab Results  ?Component Value Date  ? ALT 15 06/08/2020  ? AST 17 06/08/2020  ? ALKPHOS 100 05/25/2019  ? BILITOT 0.6 06/08/2020  ? ?Lab  Results  ?Component Value Date  ? CHOL 229 (H) 06/08/2020  ? ?Lab Results  ?Component Value Date  ? HDL 64 06/08/2020  ? ?Lab Results  ?Component Value Date  ? LDLCALC 136 (H) 06/08/2020  ? ?Lab Results  ?Component Value Date  ? TRIG 157 (H) 06/08/2020  ? ?Lab Results  ?Component Value Date  ? CHOLHDL 3.6 06/08/2020  ? ?ASSESSMENT AND PLAN:  ? ? ?Health maintenance exam: ?Reviewed age and gender appropriate health maintenance issues (prudent diet, regular exercise, health risks of tobacco and excessive alcohol, use of seatbelts, fire alarms in home, use of sunscreen).  Also reviewed age and gender appropriate health screening as well as vaccine recommendations. ?Vaccines: ALL UTD. ?Labs: CMET, CBC, FLP ordered. ?Cervical ca screening: hx of hysterectomy for benign dx, followed by GYN MD, no further paps. ?Breast ca screening: gets annual mammogram via her GYN office. ?Colon ca screening: Hx of adenomas->next colonoscopy due 2025. ?Osteoporosis screening: had DEXA 2020 at GYN MD, per pt this showed stable bone density. ? ?She is doing well on Wellbutrin XL long-term for history of anxiety and depression. ?She wishes to continue this indefinitely. ? ?She does take naproxen occasionally for hips and knees pain if she has been very active but does not need it it very much. ? ?An After Visit Summary was printed and given to the patient. ? ?FOLLOW UP:  Return in about 1 year (around 11/23/2022) for annual CPE (fasting). ? ?Signed:  Crissie Sickles, MD           11/22/2021 ? ?

## 2021-11-27 ENCOUNTER — Ambulatory Visit: Payer: Medicare Other

## 2021-12-11 NOTE — Progress Notes (Signed)
This encounter was created in error - please disregard. Left voice message for pt to call back to reschedule.  ?

## 2021-12-23 NOTE — Telephone Encounter (Signed)
Please contact pt and sched appt. Thanks.

## 2021-12-23 NOTE — Telephone Encounter (Signed)
LM for pt to return call to discuss.  

## 2021-12-25 ENCOUNTER — Encounter: Payer: Self-pay | Admitting: Family Medicine

## 2021-12-25 ENCOUNTER — Ambulatory Visit: Payer: Medicare Other | Admitting: Family Medicine

## 2021-12-25 VITALS — BP 118/74 | HR 76 | Temp 97.9°F | Ht 65.5 in | Wt 153.2 lb

## 2021-12-25 DIAGNOSIS — R3989 Other symptoms and signs involving the genitourinary system: Secondary | ICD-10-CM | POA: Diagnosis not present

## 2021-12-25 DIAGNOSIS — N3000 Acute cystitis without hematuria: Secondary | ICD-10-CM | POA: Diagnosis not present

## 2021-12-25 LAB — POCT URINALYSIS DIPSTICK
Bilirubin, UA: NEGATIVE
Glucose, UA: NEGATIVE
Ketones, UA: NEGATIVE
Protein, UA: NEGATIVE
Spec Grav, UA: <=1.005 — AB (ref 1.010–1.025)
Urobilinogen, UA: 0.2 E.U./dL
pH, UA: 5.5 (ref 5.0–8.0)

## 2021-12-25 MED ORDER — NITROFURANTOIN MONOHYD MACRO 100 MG PO CAPS: 100.0000 mg | ORAL_CAPSULE | Freq: Two times a day (BID) | ORAL | 0 refills | Status: DC

## 2021-12-25 NOTE — Telephone Encounter (Signed)
Pt already scheduled

## 2021-12-25 NOTE — Progress Notes (Signed)
OFFICE VISIT  12/25/2021  CC:  Chief Complaint  Patient presents with   Urinary Pressure    Started last night, has taken 1 dose AZO    Patient is a 71 y.o. female who presents for bladder pressure  INTERIM HX: Symptoms began yesterday: Urinary urgency and frequency, dysuria, pressure over the bladder. No fever, no nausea, no flank pain, no gross hematuria.  Took dose of Azo last night. In review of records it seems she has had about 1 UTI per year, cultures have shown pansensitive E. coli. Most recent was July 2022.  Past Medical History:  Diagnosis Date   Depression    GERD (gastroesophageal reflux disease)    History of adenomatous polyp of colon    History of hiatal hernia    History of vitamin D deficiency    pt denies   Hypercholesterolemia 2020/2021   statin recommended 2021   Osteoarthritis    Multiple joints; left THR, right TKA   Osteoporosis 09/2014 DEXA   Worsening osteoporosis R hip.  Pt declined trial of bisphosphonate from GYN. Rpt DEXA at GYN 2020 "improved" per pt report.   Paraesophageal hiatal hernia    Surgically repaired 09/2014   Urinary tract infection    hx of     Past Surgical History:  Procedure Laterality Date   COLONOSCOPY  09/16/2018   repeat 5 years, tubular adenomas   COLONOSCOPY W/ POLYPECTOMY  Multiple.  Most recent 04/15/13.    2 adenomas; repeat 5 yrs (Dr. Cristina Gong)   DEXA  12/16/2016   T-score -2.5 (improved)-Dr. McComb.  Repeat 3 yrs.   EYE SURGERY Right 11/2016   cataract    HIATAL HERNIA REPAIR N/A 09/27/2014   Procedure: Laparoscopic takedown of large type III hiatus hernia and Nissen fundoplication over #74 dilator;  Surgeon: Pedro Earls, MD;  Location: [facility];  Service: General;  Laterality: N/A;   PARS PLANA VITRECTOMY W/ REPAIR OF MACULAR HOLE  07/21/2016   TOTAL HIP ARTHROPLASTY  2008   left (Dr. Noemi Chapel)   TOTAL KNEE ARTHROPLASTY  11/2013   Right.  [facility]: Dr. Al Corpus   VAGINAL HYSTERECTOMY  1991   fibroids (both  ovaries still in)    Outpatient Medications Prior to Visit  Medication Sig Dispense Refill   buPROPion (WELLBUTRIN XL) 300 MG 24 hr tablet Take 300 mg by mouth daily.     naproxen (NAPROSYN) 500 MG tablet Take 1 tablet (500 mg total) by mouth 2 (two) times daily with a meal. 60 tablet 0   triamcinolone cream (KENALOG) 0.1 % Apply 1 application topically 2 (two) times daily. (Patient not taking: Reported on 07/04/2020) 30 g 0   No facility-administered medications prior to visit.    No Known Allergies  ROS As per HPI  PE:    12/25/2021    4:06 PM 11/22/2021    9:51 AM 02/22/2021    2:51 PM  Vitals with BMI  Height 5' 5.5" 5' 5.5"   Weight 153 lbs 3 oz 149 lbs 10 oz 156 lbs  BMI 12.8 78.67   Systolic 672 094 709  Diastolic 74 68 76  Pulse 76 70 81     Physical Exam  Gen: Alert, well appearing.  Patient is oriented to person, place, time, and situation. AFFECT: pleasant, lucid thought and speech. No further exam today  LABS:  Last CBC Lab Results  Component Value Date   WBC 6.1 11/22/2021   HGB 12.6 11/22/2021   HCT 38.5 11/22/2021  MCV 83.9 11/22/2021   MCH 27.8 06/08/2020   RDW 14.3 11/22/2021   PLT 200.0 52/77/8242   Last metabolic panel Lab Results  Component Value Date   GLUCOSE 91 11/22/2021   NA 142 11/22/2021   K 4.3 11/22/2021   CL 106 11/22/2021   CO2 29 11/22/2021   BUN 14 11/22/2021   CREATININE 0.98 11/22/2021   GFRNONAA 86 (L) 09/29/2014   CALCIUM 8.8 11/22/2021   PROT 6.4 11/22/2021   ALBUMIN 4.2 11/22/2021   BILITOT 0.6 11/22/2021   ALKPHOS 90 11/22/2021   AST 18 11/22/2021   ALT 15 11/22/2021   ANIONGAP 7 09/29/2014   POC CC UA today: small LE, trace blood, o/w normal. Yellow  IMPRESSION AND PLAN:  UTI, simple. Sent urine for C/S. Macrobid 100 mg twice daily x3 days.  An After Visit Summary was printed and given to the patient.  FOLLOW UP: Return today (on 12/25/2021), or if symptoms worsen or fail to improve.  Signed:   Crissie Sickles, MD           12/25/2021

## 2021-12-27 LAB — URINE CULTURE
MICRO NUMBER:: 13440270
SPECIMEN QUALITY:: ADEQUATE

## 2021-12-31 ENCOUNTER — Ambulatory Visit (INDEPENDENT_AMBULATORY_CARE_PROVIDER_SITE_OTHER): Payer: Medicare Other

## 2021-12-31 DIAGNOSIS — Z0000 Encounter for general adult medical examination without abnormal findings: Secondary | ICD-10-CM | POA: Diagnosis not present

## 2021-12-31 NOTE — Patient Instructions (Signed)
[name] , Thank you for taking time to come for your Medicare Wellness Visit. I appreciate your ongoing commitment to your health goals. Please review the following plan we discussed and let me know if I can assist you in the future.   Screening recommendations/referrals: Colonoscopy: 09/16/2018  due 2025 Mammogram: 06/17/2021 Bone Density: 06/18/2021 Recommended yearly ophthalmology/optometry visit for glaucoma screening and checkup Recommended yearly dental visit for hygiene and checkup  Vaccinations: Influenza vaccine: completed  Pneumococcal vaccine: completed  Tdap vaccine: 06/13/2014 Shingles vaccine: completed     Advanced directives: yes   Conditions/risks identified: none   Next appointment: none    Preventive Care 73 Years and Older, Female Preventive care refers to lifestyle choices and visits with your health care provider that can promote health and wellness. What does preventive care include? A yearly physical exam. This is also called an annual well check. Dental exams once or twice a year. Routine eye exams. Ask your health care provider how often you should have your eyes checked. Personal lifestyle choices, including: Daily care of your teeth and gums. Regular physical activity. Eating a healthy diet. Avoiding tobacco and drug use. Limiting alcohol use. Practicing safe sex. Taking low-dose aspirin every day. Taking vitamin and mineral supplements as recommended by your health care provider. What happens during an annual well check? The services and screenings done by your health care provider during your annual well check will depend on your age, overall health, lifestyle risk factors, and family history of disease. Counseling  Your health care provider may ask you questions about your: Alcohol use. Tobacco use. Drug use. Emotional well-being. Home and relationship well-being. Sexual activity. Eating habits. History of falls. Memory and ability  to understand (cognition). Work and work Statistician. Reproductive health. Screening  You may have the following tests or measurements: Height, weight, and BMI. Blood pressure. Lipid and cholesterol levels. These may be checked every 5 years, or more frequently if you are over 38 years old. Skin check. Lung cancer screening. You may have this screening every year starting at age 68 if you have a 30-pack-year history of smoking and currently smoke or have quit within the past 15 years. Fecal occult blood test (FOBT) of the stool. You may have this test every year starting at age 2. Flexible sigmoidoscopy or colonoscopy. You may have a sigmoidoscopy every 5 years or a colonoscopy every 10 years starting at age 55. Hepatitis C blood test. Hepatitis B blood test. Sexually transmitted disease (STD) testing. Diabetes screening. This is done by checking your blood sugar (glucose) after you have not eaten for a while (fasting). You may have this done every 1-3 years. Bone density scan. This is done to screen for osteoporosis. You may have this done starting at age 80. Mammogram. This may be done every 1-2 years. Talk to your health care provider about how often you should have regular mammograms. Talk with your health care provider about your test results, treatment options, and if necessary, the need for more tests. Vaccines  Your health care provider may recommend certain vaccines, such as: Influenza vaccine. This is recommended every year. Tetanus, diphtheria, and acellular pertussis (Tdap, Td) vaccine. You may need a Td booster every 10 years. Zoster vaccine. You may need this after age 40. Pneumococcal 13-valent conjugate (PCV13) vaccine. One dose is recommended after age 83. Pneumococcal polysaccharide (PPSV23) vaccine. One dose is recommended after age 38. Talk to your health care provider about which screenings and vaccines you need and  how often you need them. This information is not  intended to replace advice given to you by your health care provider. Make sure you discuss any questions you have with your health care provider. Document Released: 08/17/2015 Document Revised: 04/09/2016 Document Reviewed: 05/22/2015 Elsevier Interactive Patient Education  2017 [place] Prevention in the Home Falls can cause injuries. They can happen to people of all ages. There are many things you can do to make your home safe and to help prevent falls. What can I do on the outside of my home? Regularly fix the edges of walkways and driveways and fix any cracks. Remove anything that might make you trip as you walk through a door, such as a raised step or threshold. Trim any bushes or trees on the path to your home. Use bright outdoor lighting. Clear any walking paths of anything that might make someone trip, such as rocks or tools. Regularly check to see if handrails are loose or broken. Make sure that both sides of any steps have handrails. Any raised decks and porches should have guardrails on the edges. Have any leaves, snow, or ice cleared regularly. Use sand or salt on walking paths during winter. Clean up any spills in your garage right away. This includes oil or grease spills. What can I do in the bathroom? Use night lights. Install grab bars by the toilet and in the tub and shower. Do not use towel bars as grab bars. Use non-skid mats or decals in the tub or shower. If you need to sit down in the shower, use a plastic, non-slip stool. Keep the floor dry. Clean up any water that spills on the floor as soon as it happens. Remove soap buildup in the tub or shower regularly. Attach bath mats securely with double-sided non-slip rug tape. Do not have throw rugs and other things on the floor that can make you trip. What can I do in the bedroom? Use night lights. Make sure that you have a light by your bed that is easy to reach. Do not use any sheets or blankets that are  too big for your bed. They should not hang down onto the floor. Have a firm chair that has side arms. You can use this for support while you get dressed. Do not have throw rugs and other things on the floor that can make you trip. What can I do in the kitchen? Clean up any spills right away. Avoid walking on wet floors. Keep items that you use a lot in easy-to-reach places. If you need to reach something above you, use a strong step stool that has a grab bar. Keep electrical cords out of the way. Do not use floor polish or wax that makes floors slippery. If you must use wax, use non-skid floor wax. Do not have throw rugs and other things on the floor that can make you trip. What can I do with my stairs? Do not leave any items on the stairs. Make sure that there are handrails on both sides of the stairs and use them. Fix handrails that are broken or loose. Make sure that handrails are as long as the stairways. Check any carpeting to make sure that it is firmly attached to the stairs. Fix any carpet that is loose or worn. Avoid having throw rugs at the top or bottom of the stairs. If you do have throw rugs, attach them to the floor with carpet tape. Make sure that you have  a light switch at the top of the stairs and the bottom of the stairs. If you do not have them, ask someone to add them for you. What else can I do to help prevent falls? Wear shoes that: Do not have high heels. Have rubber bottoms. Are comfortable and fit you well. Are closed at the toe. Do not wear sandals. If you use a stepladder: Make sure that it is fully opened. Do not climb a closed stepladder. Make sure that both sides of the stepladder are locked into place. Ask someone to hold it for you, if possible. Clearly mark and make sure that you can see: Any grab bars or handrails. First and last steps. Where the edge of each step is. Use tools that help you move around (mobility aids) if they are needed. These  include: Canes. Walkers. Scooters. Crutches. Turn on the lights when you go into a dark area. Replace any light bulbs as soon as they burn out. Set up your furniture so you have a clear path. Avoid moving your furniture around. If any of your floors are uneven, fix them. If there are any pets around you, be aware of where they are. Review your medicines with your doctor. Some medicines can make you feel dizzy. This can increase your chance of falling. Ask your doctor what other things that you can do to help prevent falls. This information is not intended to replace advice given to you by your health care provider. Make sure you discuss any questions you have with your health care provider. Document Released: 05/17/2009 Document Revised: 12/27/2015 Document Reviewed: 08/25/2014 Elsevier Interactive Patient Education  2017 [employer].

## 2021-12-31 NOTE — Progress Notes (Signed)
Subjective:   Katelyn Cuevas is a 71 y.o. female who presents for Medicare Annual (Subsequent) preventive examination.   I connected with Shyniece Scripter today by telephone and verified that I am speaking with the correct person using two identifiers. Location patient: home Location provider: work Persons participating in the virtual visit: patient, provider.   I discussed the limitations, risks, security and privacy concerns of performing an evaluation and management service by telephone and the availability of in person appointments. I also discussed with the patient that there may be a patient responsible charge related to this service. The patient expressed understanding and verbally consented to this telephonic visit.    Interactive audio and video telecommunications were attempted between this provider and patient, however failed, due to patient having technical difficulties OR patient did not have access to video capability.  We continued and completed visit with audio only.    Review of Systems     Cardiac Risk Factors include: advanced age (>70mn, >>18women)     Objective:    Today's Vitals   There is no height or weight on file to calculate BMI.     12/31/2021   10:06 AM 07/04/2020   10:34 AM 10/01/2018    2:11 PM 09/04/2017   11:21 AM 09/01/2016   10:46 AM 09/27/2014    9:57 AM 09/19/2014    4:30 PM  Advanced Directives  Does Patient Have a Medical Advance Directive? Yes Yes Yes Yes Yes  Yes  Type of AParamedicof ADet[place]Living will [place] will Living will;Healthcare Power of [place]Living will [place] will  [place] Does patient want to make changes to medical advance directive?      No - Patient declined No - Patient declined  Copy of [place] Chart? No - copy requested Yes - validated most recent copy  scanned in chart (See row information) No - copy requested No - copy requested No - copy requested  Yes    Current Medications (verified) Outpatient Encounter Medications as of 12/31/2021  Medication Sig   buPROPion (WELLBUTRIN XL) 300 MG 24 hr tablet Take 300 mg by mouth daily.   naproxen (NAPROSYN) 500 MG tablet Take 1 tablet (500 mg total) by mouth 2 (two) times daily with a meal.   triamcinolone cream (KENALOG) 0.1 % Apply 1 application topically 2 (two) times daily.   nitrofurantoin, macrocrystal-monohydrate, (MACROBID) 100 MG capsule Take 1 capsule (100 mg total) by mouth 2 (two) times daily. (Patient not taking: Reported on 12/31/2021)   No facility-administered encounter medications on file as of 12/31/2021.    Allergies (verified) Patient has no known allergies.   History: Past Medical History:  Diagnosis Date   Depression    GERD (gastroesophageal reflux disease)    History of adenomatous polyp of colon    History of hiatal hernia    History of vitamin D deficiency    pt denies   Hypercholesterolemia 2020/2021   statin recommended 2021   Osteoarthritis    Multiple joints; left THR, right TKA   Osteoporosis 09/2014 DEXA   Worsening osteoporosis R hip.  Pt declined trial of bisphosphonate from GYN. Rpt DEXA at GYN 2020 "improved" per pt report.   Paraesophageal hiatal hernia    Surgically repaired 09/2014   Urinary tract infection    hx of    Past Surgical History:  Procedure Laterality Date  COLONOSCOPY  09/16/2018   repeat 5 years, tubular adenomas   COLONOSCOPY W/ POLYPECTOMY  Multiple.  Most recent 04/15/13.    2 adenomas; repeat 5 yrs (Dr. Cristina Gong)   DEXA  12/16/2016   T-score -2.5 (improved)-Dr. McComb.  Repeat 3 yrs.   EYE SURGERY Right 11/2016   cataract    HIATAL HERNIA REPAIR N/A 09/27/2014   Procedure: Laparoscopic takedown of large type III hiatus hernia and Nissen fundoplication over #16 dilator;  Surgeon: Pedro Earls, MD;  Location: [facility];   Service: General;  Laterality: N/A;   PARS PLANA VITRECTOMY W/ REPAIR OF MACULAR HOLE  07/21/2016   TOTAL HIP ARTHROPLASTY  2008   left (Dr. Noemi Chapel)   TOTAL KNEE ARTHROPLASTY  11/2013   Right.  [facility]: Dr. Al Corpus   VAGINAL HYSTERECTOMY  1991   fibroids (both ovaries still in)   Family History  Problem Relation Age of Onset   Emphysema Mother    Stroke Father    Hypertension Father    Hyperlipidemia Father    Heart disease Sister        Afib   Retinal detachment Brother    Cancer Sister        bladder   Social History   Socioeconomic History   Marital status: Married    Spouse name: Not on file   Number of children: Not on file   Years of education: Not on file   Highest education level: Not on file  Occupational History   Not on file  Tobacco Use   Smoking status: Never   Smokeless tobacco: Never  Vaping Use   Vaping Use: Never used  Substance and Sexual Activity   Alcohol use: No   Drug use: No   Sexual activity: Not on file  Other Topics Concern   Not on file  Social History Narrative    Married, 2 children.   Occupation: real estate agent   No T/A/Ds.      Social Determinants of Health   Financial Resource Strain: Low Risk    Difficulty of Paying Living Expenses: Not hard at all  Food Insecurity: No Food Insecurity   Worried About Charity fundraiser in the Last Year: Never true   [place] in the Last Year: Never true  Transportation Needs: No Transportation Needs   Lack of Transportation (Medical): No   Lack of Transportation (Non-Medical): No  Physical Activity: Insufficiently Active   Days of Exercise per Week: 3 days   Minutes of Exercise per Session: 30 min  Stress: No Stress Concern Present   Feeling of Stress : Not at all  Social Connections: Socially Integrated   Frequency of Communication with Friends and Family: Twice a week   Frequency of Social Gatherings with Friends and Family: Twice a week   Attends Religious Services: More than  4 times per year   Active Member of [employer] or Organizations: Yes   Attends Music therapist: More than 4 times per year   Marital Status: Married    Tobacco Counseling Counseling given: Not Answered   Clinical Intake:  Pre-visit preparation completed: Yes  Pain : No/denies pain     Nutritional Risks: None Diabetes: No  How often do you need to have someone help you when you read instructions, pamphlets, or other written materials from your doctor or pharmacy?: 1 - Never What is the last grade level you completed in school?: college  Diabetic?no   Interpreter Needed?: No  Information entered by :: L.Rex Oesterle,LPN   Activities of Daily Living    12/31/2021   10:10 AM  In your present state of health, do you have any difficulty performing the following activities:  Hearing? 0  Vision? 0  Difficulty concentrating or making decisions? 0  Walking or climbing stairs? 0  Dressing or bathing? 0  Doing errands, shopping? 0  Preparing Food and eating ? N  Using the Toilet? N  In the past six months, have you accidently leaked urine? N  Do you have problems with loss of bowel control? N  Managing your Medications? N  Managing your Finances? N  Housekeeping or managing your Housekeeping? N    Patient Care Team: Tammi Sou, MD as PCP - General (Family Medicine) Ronald Lobo, MD as Consulting Physician (Gastroenterology) Johnathan Hausen, MD as Consulting Physician (General Surgery) Dermatology, [facility], MD as Referring Physician (Orthopedic Surgery) Arvella Nigh, MD as Consulting Physician (Obstetrics and Gynecology) Keene Breath., MD (Ophthalmology)  Indicate any recent Medical Services you may have received from other than [facility] providers in the past year (date may be approximate).     Assessment:   This is a routine wellness examination for [place].  Hearing/Vision screen Vision Screening - Comments:: Annual eye exams  wear glasses   Dietary issues and exercise activities discussed: Current Exercise Habits: Home exercise routine, Type of exercise: walking, Time (Minutes): 30, Frequency (Times/Week): 3, Weekly Exercise (Minutes/Week): 90, Intensity: Mild, Exercise limited by: None identified   Goals Addressed   None    Depression Screen    12/31/2021   10:07 AM 12/31/2021   10:04 AM 11/22/2021    9:57 AM 07/04/2020   10:36 AM 05/25/2019   10:18 AM 10/01/2018    2:12 PM 09/04/2017   11:22 AM  PHQ 2/9 Scores  PHQ - 2 Score 0 0 0 0 0 0 0  PHQ- 9 Score     0 0     Fall Risk    12/31/2021   10:06 AM 11/22/2021    9:56 AM 07/04/2020   10:35 AM 06/08/2020    1:07 PM 10/01/2018    2:12 PM  [place] in the past year? 0 0 0 0 0  Number falls in past yr: 0 0 0 0   Injury with Fall? 0 0 0 0   Risk for fall due to :  Orthopedic patient     Follow up Falls evaluation completed Falls evaluation completed Falls prevention discussed Falls evaluation completed     [place]:  Any stairs in or around the home? Yes  If so, are there any without handrails? No  Home free of loose throw rugs in walkways, pet beds, electrical cords, etc? Yes  Adequate lighting in your home to reduce risk of falls? Yes   ASSISTIVE DEVICES UTILIZED TO PREVENT FALLS:  Life alert? No  Use of a cane, walker or w/c? No  Grab bars in the bathroom? No  Shower chair or bench in shower? No  Elevated toilet seat or a handicapped toilet? No    Cognitive Function:Normal cognitive status assessed by telephone conversation  by this Nurse Health Advisor. No abnormalities found.      10/01/2018    2:14 PM  MMSE - Mini Mental State Exam  Orientation to time 5  Orientation to Place 5  Registration 3  Attention/ Calculation 5  Recall 2  Language- name 2 objects  2  Language- repeat 1  Language- follow 3 step command 3  Language- read & follow direction 1  Write a sentence 1  Copy design 1   Total score 29        Immunizations Immunization History  Administered Date(s) Administered   Fluad Quad(high Dose 65+) 05/25/2019, 06/08/2020   Influenza Whole 06/26/2009   Influenza, High Dose Seasonal PF 05/12/2018   Influenza,inj,Quad PF,6+ Mos 05/13/2014   Influenza-Unspecified 05/30/2015, 05/12/2016, 06/03/2017, 05/24/2021   PFIZER(Purple Top)SARS-COV-2 Vaccination 09/01/2019, 09/27/2019, 05/22/2020   Pneumococcal Conjugate-13 06/13/2014, 05/30/2015   Pneumococcal Polysaccharide-23 05/12/2016   Tdap 06/13/2014   Zoster Recombinat (Shingrix) 06/07/2019, 11/15/2019   Zoster, Live 07/27/2013    TDAP status: Up to date  Flu Vaccine status: Up to date  Pneumococcal vaccine status: Up to date  Covid-19 vaccine status: Completed vaccines  Qualifies for Shingles Vaccine? Yes   Zostavax completed No   Shingrix Completed?: Yes  Screening Tests Health Maintenance  Topic Date Due   COVID-19 Vaccine (4 - Booster for Pfizer series) 07/17/2020   INFLUENZA VACCINE  03/04/2022   MAMMOGRAM  06/17/2022   COLONOSCOPY (Pts 45-12yr Insurance coverage will need to be confirmed)  09/17/2023   TETANUS/TDAP  06/13/2024   Pneumonia Vaccine 71 Years old  Completed   DEXA SCAN  Completed   Hepatitis C Screening  Completed   Zoster Vaccines- Shingrix  Completed   HPV VACCINES  Aged Out    Health Maintenance  Health Maintenance Due  Topic Date Due   COVID-19 Vaccine (4 - Booster for [place]) 07/17/2020    Colorectal cancer screening: Type of screening: Colonoscopy. Completed 09/16/2018. Repeat every 5 years  Mammogram status: Completed 06/17/2021. Repeat every year  Bone Density status: Completed 06/18/2021. Results reflect: Bone density results: OSTEOPENIA. Repeat every 5 years.  Lung Cancer Screening: (Low Dose CT Chest recommended if Age 73-80years, 30 pack-year currently smoking OR have quit w/in 15years.) does not qualify.   Lung Cancer Screening Referral:  n/a  Additional Screening:  Hepatitis C Screening: does not qualify;  Vision Screening: Recommended annual ophthalmology exams for early detection of glaucoma and other disorders of the eye. Is the patient up to date with their annual eye exam?  Yes  Who is the provider or what is the name of the office in which the patient attends annual eye exams? Dr.Wood  If pt is not established with a provider, would they like to be referred to a provider to establish care? No .   Dental Screening: Recommended annual dental exams for proper oral hygiene  Community Resource Referral / Chronic Care Management: CRR required this visit?  No   CCM required this visit?  No      Plan:     I have personally reviewed and noted the following in the patient's chart:   Medical and social history Use of alcohol, tobacco or illicit drugs  Current medications and supplements including opioid prescriptions.  Functional ability and status Nutritional status Physical activity Advanced directives List of other physicians Hospitalizations, surgeries, and ER visits in previous 12 months Vitals Screenings to include cognitive, depression, and falls Referrals and appointments  In addition, I have reviewed and discussed with patient certain preventive protocols, quality metrics, and best practice recommendations. A written personalized care plan for preventive services as well as general preventive health recommendations were provided to patient.     LRandel Pigg LPN   [id number]  Nurse Notes: none

## 2022-03-30 ENCOUNTER — Other Ambulatory Visit: Payer: Self-pay | Admitting: Family Medicine

## 2022-03-30 DIAGNOSIS — M542 Cervicalgia: Secondary | ICD-10-CM

## 2022-06-13 ENCOUNTER — Encounter: Payer: Self-pay | Admitting: Family Medicine

## 2022-06-16 NOTE — Telephone Encounter (Signed)
Fortunately, you are not at high risk for any complications if you do get RSV. Therefore, I feel it is fine to not get this vaccine.

## 2022-06-23 ENCOUNTER — Other Ambulatory Visit: Payer: Self-pay | Admitting: Obstetrics and Gynecology

## 2022-06-23 DIAGNOSIS — R928 Other abnormal and inconclusive findings on diagnostic imaging of breast: Secondary | ICD-10-CM

## 2022-07-14 ENCOUNTER — Ambulatory Visit
Admission: RE | Admit: 2022-07-14 | Discharge: 2022-07-14 | Disposition: A | Discharge status: Home / Self Care | Payer: Medicare Other | Source: Ambulatory Visit | Attending: Obstetrics and Gynecology | Admitting: Obstetrics and Gynecology

## 2022-07-14 ENCOUNTER — Other Ambulatory Visit: Payer: Self-pay | Admitting: Obstetrics and Gynecology

## 2022-07-14 DIAGNOSIS — R928 Other abnormal and inconclusive findings on diagnostic imaging of breast: Secondary | ICD-10-CM

## 2022-07-14 DIAGNOSIS — N6320 Unspecified lump in the left breast, unspecified quadrant: Secondary | ICD-10-CM

## 2022-07-14 LAB — HM MAMMOGRAPHY

## 2022-07-21 ENCOUNTER — Telehealth: Payer: Self-pay | Admitting: Family Medicine

## 2022-07-21 NOTE — Telephone Encounter (Signed)
Pts husband called and is asking if something can be called in for her being as she tested positive for Covid today and so did he. He however is not having any symptoms, but states his wife is experiencing some but with no fever. Please contact patient if able to honor request. He states [facility] [place] [facility] any medication sent to.

## 2022-07-21 NOTE — Telephone Encounter (Signed)
Patient called back because she stated that she is waiting on meds to be called into pharmacy. I told her someone tried to call at 2:30pm. I asked when her symptoms started she said on Thursday.  She said she doesn't feel well. Mild sore throat, some congestion, no fever.  She asked if her husband was going to get treated. Message state he has no symptoms, I told her I am not sure if he would be treated with no symptoms.  Symptoms must be within 5 days, today is day 4. Contradictory stories regarding symptoms and start date  Please see his chart.

## 2022-07-21 NOTE — Telephone Encounter (Signed)
OK, if within 5 days of sx's onset then I'll rx molnupiravir.  No o/v needed Let me know.

## 2022-07-21 NOTE — Telephone Encounter (Signed)
LVM for pt top CB re: day of first sx.    Please advise if appt is needed.

## 2022-07-22 MED ORDER — MOLNUPIRAVIR EUA 200MG CAPSULE: 4.0000 | ORAL_CAPSULE | Freq: Two times a day (BID) | ORAL | 0 refills | Status: AC

## 2022-07-22 NOTE — Telephone Encounter (Signed)
Molnupiravir eRxd for [place]. If husband does not have any symptoms then no antiviral medication is indicated (it is not indicated for postexposure prophylaxis).

## 2022-07-22 NOTE — Telephone Encounter (Signed)
Tried calling patient, unable to LVM.  If pt returns call, please advise antiviral sent to pharmacy.

## 2022-07-22 NOTE — Telephone Encounter (Signed)
Please advise if medication can be sent.

## 2022-07-22 NOTE — Telephone Encounter (Signed)
Pt aware medication sent

## 2022-07-23 ENCOUNTER — Ambulatory Visit
Admission: RE | Admit: 2022-07-23 | Discharge: 2022-07-23 | Disposition: A | Discharge status: Home / Self Care | Payer: Medicare Other | Source: Ambulatory Visit | Attending: Obstetrics and Gynecology | Admitting: Obstetrics and Gynecology

## 2022-07-23 DIAGNOSIS — N6320 Unspecified lump in the left breast, unspecified quadrant: Secondary | ICD-10-CM

## 2022-07-25 ENCOUNTER — Encounter: Payer: Self-pay | Admitting: Family Medicine

## 2022-07-29 NOTE — Telephone Encounter (Signed)
Spoke with patient, she is improving and nothing further is needed from [place].

## 2022-07-29 NOTE — Telephone Encounter (Signed)
Pls see if Katelyn Cuevas is improving since her last message. Paxlovid is not an option at this point in her illness. If she is still having significant cough then I recommend starting doxycycline.  Pls do rx for 100 mg tabs, 1 bid x 7d, #14, no RF. F/u with me later this week unless getting a lot better.  I'll work her in if no appt available.

## 2022-11-24 ENCOUNTER — Encounter: Payer: Medicare Other | Admitting: Family Medicine

## 2022-11-24 NOTE — Progress Notes (Deleted)
Office Note 11/24/2022  CC: No chief complaint on file.  Patient is a 72 y.o. female who is here for annual health maintenance exam and follow-up depression.. A/P as of last visit: "Health maintenance exam: Reviewed age and gender appropriate health maintenance issues (prudent diet, regular exercise, health risks of tobacco and excessive alcohol, use of seatbelts, fire alarms in home, use of sunscreen).  Also reviewed age and gender appropriate health screening as well as vaccine recommendations. Vaccines: ALL UTD. Labs: CMET, CBC, FLP ordered. Cervical ca screening: hx of hysterectomy for benign dx, followed by GYN MD, no further paps. Breast ca screening: gets annual mammogram via her GYN office. Colon ca screening: Hx of adenomas->next colonoscopy due 2025. Osteoporosis screening: had DEXA 2020 at GYN MD, per pt this showed stable bone density. She is doing well on Wellbutrin XL long-term for history of anxiety and depression. She wishes to continue this indefinitely. She does take naproxen occasionally for hips and knees pain if she has been very active but does not need it it very much."   INTERIM HX: ***   Past Medical History:  Diagnosis Date   Depression    GERD (gastroesophageal reflux disease)    History of adenomatous polyp of colon    History of hiatal hernia    History of vitamin D deficiency    pt denies   Hypercholesterolemia 2020/2021   statin recommended 2021   Osteoarthritis    Multiple joints; left THR, right TKA   Osteoporosis 09/2014 DEXA   Worsening osteoporosis R hip.  Pt declined trial of bisphosphonate from GYN. Rpt DEXA at GYN 2020 "improved" per pt report.   Paraesophageal hiatal hernia    Surgically repaired 09/2014   Urinary tract infection    hx of     Past Surgical History:  Procedure Laterality Date   COLONOSCOPY  09/16/2018   repeat 5 years, tubular adenomas   COLONOSCOPY W/ POLYPECTOMY  Multiple.  Most recent 04/15/13.    2 adenomas;  repeat 5 yrs (Dr. Matthias Hughs)   DEXA  12/16/2016   T-score -2.5 (improved)-Dr. McComb.  Repeat 3 yrs.   EYE SURGERY Right 11/2016   cataract    HIATAL HERNIA REPAIR N/A 09/27/2014   Procedure: Laparoscopic takedown of large type III hiatus hernia and Nissen fundoplication over #56 dilator;  Surgeon: Valarie Merino, MD;  Location: [facility];  Service: General;  Laterality: N/A;   PARS PLANA VITRECTOMY W/ REPAIR OF MACULAR HOLE  07/21/2016   TOTAL HIP ARTHROPLASTY  2008   left (Dr. Thurston Hole)   TOTAL KNEE ARTHROPLASTY  11/2013   Right.  [facility]: Dr. Shella Spearing   VAGINAL HYSTERECTOMY  1991   fibroids (both ovaries still in)    Family History  Problem Relation Age of Onset   Emphysema Mother    Stroke Father    Hypertension Father    Hyperlipidemia Father    Heart disease Sister        Afib   Retinal detachment Brother    Cancer Sister        bladder    Social History   Socioeconomic History   Marital status: Married    Spouse name: Not on file   Number of children: Not on file   Years of education: Not on file   Highest education level: Not on file  Occupational History   Not on file  Tobacco Use   Smoking status: Never   Smokeless tobacco: Never  Vaping Use   Vaping Use:  Never used  Substance and Sexual Activity   Alcohol use: No   Drug use: No   Sexual activity: Not on file  Other Topics Concern   Not on file  Social History Narrative   ** Merged History Encounter **        Married, 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     Social Determinants of Health   Financial Resource Strain: Low Risk  (12/31/2021)   Overall Financial Resource Strain (CARDIA)    Difficulty of Paying Living Expenses: Not hard at all  Food Insecurity: No Food Insecurity (12/31/2021)   Hunger Vital Sign    Worried About Running Out of Food in the Last Year: Never true    Ran Out of Food in the Last Year: Never true  Transportation Needs: No Transportation Needs (12/31/2021)    PRAPARE - Administrator, Civil Service (Medical): No    Lack of Transportation (Non-Medical): No  Physical Activity: Insufficiently Active (12/31/2021)   Exercise Vital Sign    Days of Exercise per Week: 3 days    Minutes of Exercise per Session: 30 min  Stress: No Stress Concern Present (12/31/2021)   [employer] of Occupational Health - Occupational Stress Questionnaire    Feeling of Stress : Not at all  Social Connections: Socially Integrated (12/31/2021)   Social Connection and Isolation Panel [NHANES]    Frequency of Communication with Friends and Family: Twice a week    Frequency of Social Gatherings with Friends and Family: Twice a week    Attends Religious Services: More than 4 times per year    Active Member of [employer] or Organizations: Yes    Attends Engineer, structural: More than 4 times per year    Marital Status: Married  Catering manager Violence: Not At Risk (12/31/2021)   Humiliation, Afraid, Rape, and Kick questionnaire    Fear of Current or Ex-Partner: No    Emotionally Abused: No    Physically Abused: No    Sexually Abused: No    Outpatient Medications Prior to Visit  Medication Sig Dispense Refill   buPROPion (WELLBUTRIN XL) 300 MG 24 hr tablet Take 300 mg by mouth daily.     naproxen (NAPROSYN) 500 MG tablet TAKE 1 TABLET BY MOUTH 2 TIMES DAILY WITH A MEAL. 60 tablet 5   nitrofurantoin, macrocrystal-monohydrate, (MACROBID) 100 MG capsule Take 1 capsule (100 mg total) by mouth 2 (two) times daily. (Patient not taking: Reported on 12/31/2021) 6 capsule 0   triamcinolone cream (KENALOG) 0.1 % Apply 1 application topically 2 (two) times daily. 30 g 0   No facility-administered medications prior to visit.    No Known Allergies  Review of Systems *** PE;    12/25/2021    4:06 PM 11/22/2021    9:51 AM 02/22/2021    2:51 PM  Vitals with BMI  Height 5' 5.5" 5' 5.5"   Weight 153 lbs 3 oz 149 lbs 10 oz 156 lbs  BMI 25.1 24.51   Systolic  118 107 132  Diastolic 74 68 76  Pulse 76 70 81     *** Pertinent labs:  Lab Results  Component Value Date   TSH 1.26 05/25/2019   Lab Results  Component Value Date   WBC 6.1 11/22/2021   HGB 12.6 11/22/2021   HCT 38.5 11/22/2021   MCV 83.9 11/22/2021   PLT 200.0 11/22/2021   Lab Results  Component Value Date   CREATININE 0.98 11/22/2021  BUN 14 11/22/2021   NA 142 11/22/2021   K 4.3 11/22/2021   CL 106 11/22/2021   CO2 29 11/22/2021   Lab Results  Component Value Date   ALT 15 11/22/2021   AST 18 11/22/2021   ALKPHOS 90 11/22/2021   BILITOT 0.6 11/22/2021   Lab Results  Component Value Date   CHOL 220 (H) 11/22/2021   Lab Results  Component Value Date   HDL 71.30 11/22/2021   Lab Results  Component Value Date   LDLCALC 129 (H) 11/22/2021   Lab Results  Component Value Date   TRIG 96.0 11/22/2021   Lab Results  Component Value Date   CHOLHDL 3 11/22/2021    ASSESSMENT AND PLAN:   No problem-specific Assessment & Plan notes found for this encounter.  Health maintenance exam: Reviewed age and gender appropriate health maintenance issues (prudent diet, regular exercise, health risks of tobacco and excessive alcohol, use of seatbelts, fire alarms in home, use of sunscreen).  Also reviewed age and gender appropriate health screening as well as vaccine recommendations. Vaccines: ALL UTD. Labs: CMET, CBC, FLP ordered. Cervical ca screening: hx of hysterectomy for benign dx, followed by GYN MD, no further paps. Breast ca screening: gets annual mammogram via her GYN office. Colon ca screening: Hx of adenomas->next colonoscopy due 2025. Osteoporosis screening: had DEXA 2020 at GYN MD, per pt this showed stable bone density.  An After Visit Summary was printed and given to the patient.  FOLLOW UP:  No follow-ups on file.  Signed:  Santiago Bumpers, MD           11/24/2022

## 2022-11-26 NOTE — Patient Instructions (Signed)
It was very nice to see you today!   PLEASE NOTE:   If you had any lab tests please let us know if you have not heard back within a few days. You may see your results on MyChart before we have a chance to review them but we will give you a call once they are reviewed by [place]. If we ordered any referrals today, please let us know if you have not heard from their office within the next 2 weeks. You should receive a letter via MyChart confirming if the referral was approved and their office contact information to schedule.   Health Maintenance, Female Adopting a healthy lifestyle and getting preventive care are important in promoting health and wellness. Ask your health care provider about: The right schedule for you to have regular tests and exams. Things you can do on your own to prevent diseases and keep yourself healthy. What should I know about diet, weight, and exercise? Eat a healthy diet  Eat a diet that includes plenty of vegetables, fruits, low-fat dairy products, and lean protein. Do not eat a lot of foods that are high in solid fats, added sugars, or sodium. Maintain a healthy weight Body mass index (BMI) is used to identify weight problems. It estimates body fat based on height and weight. Your health care provider can help determine your BMI and help you achieve or maintain a healthy weight. Get regular exercise Get regular exercise. This is one of the most important things you can do for your health. Most adults should: Exercise for at least 150 minutes each week. The exercise should increase your heart rate and make you sweat (moderate-intensity exercise). Do strengthening exercises at least twice a week. This is in addition to the moderate-intensity exercise. Spend less time sitting. Even light physical activity can be beneficial. Watch cholesterol and blood lipids Have your blood tested for lipids and cholesterol at 74 years of age, then have this test every 5 years. Have your  cholesterol levels checked more often if: Your lipid or cholesterol levels are high. You are older than 74 years of age. You are at high risk for heart disease. What should I know about cancer screening? Depending on your health history and family history, you may need to have cancer screening at various ages. This may include screening for: Breast cancer. Cervical cancer. Colorectal cancer. Skin cancer. Lung cancer. What should I know about heart disease, diabetes, and high blood pressure? Blood pressure and heart disease High blood pressure causes heart disease and increases the risk of stroke. This is more likely to develop in people who have high blood pressure readings or are overweight. Have your blood pressure checked: Every 3-5 years if you are 60-75 years of age. Every year if you are 22 years old or older. Diabetes Have regular diabetes screenings. This checks your fasting blood sugar level. Have the screening done: Once every three years after age 74 if you are at a normal weight and have a low risk for diabetes. More often and at a younger age if you are overweight or have a high risk for diabetes. What should I know about preventing infection? Hepatitis B If you have a higher risk for hepatitis B, you should be screened for this virus. Talk with your health care provider to find out if you are at risk for hepatitis B infection. Hepatitis C Testing is recommended for: Everyone born from [birth year] through 1967. Anyone with known risk factors for hepatitis C.  Sexually transmitted infections (STIs) Get screened for STIs, including gonorrhea and chlamydia, if: You are sexually active and are younger than 74 years of age. You are older than 74 years of age and your health care provider tells you that you are at risk for this type of infection. Your sexual activity has changed since you were last screened, and you are at increased risk for chlamydia or gonorrhea. Ask your health care  provider if you are at risk. Ask your health care provider about whether you are at high risk for HIV. Your health care provider may recommend a prescription medicine to help prevent HIV infection. If you choose to take medicine to prevent HIV, you should first get tested for HIV. You should then be tested every 3 months for as long as you are taking the medicine. Pregnancy If you are about to stop having your period (premenopausal) and you may become pregnant, seek counseling before you get pregnant. Take 400 to 800 micrograms (mcg) of folic acid every day if you become pregnant. Ask for birth control (contraception) if you want to prevent pregnancy. Osteoporosis and menopause Osteoporosis is a disease in which the bones lose minerals and strength with aging. This can result in bone fractures. If you are 65 years old or older, or if you are at risk for osteoporosis and fractures, ask your health care provider if you should: Be screened for bone loss. Take a calcium or vitamin D supplement to lower your risk of fractures. Be given hormone replacement therapy (HRT) to treat symptoms of menopause. Follow these instructions at home: Alcohol use Do not drink alcohol if: Your health care provider tells you not to drink. You are pregnant, may be pregnant, or are planning to become pregnant. If you drink alcohol: Limit how much you have to: 0-1 drink a day. Know how much alcohol is in your drink. In the [place] one drink equals one 12 oz bottle of beer (355 mL), one 5 oz glass of wine (148 mL), or one 1 oz glass of hard liquor (44 mL). Lifestyle Do not use any products that contain nicotine or tobacco. These products include cigarettes, chewing tobacco, and vaping devices, such as e-cigarettes. If you need help quitting, ask your health care provider. Do not use street drugs. Do not share needles. Ask your health care provider for help if you need support or information about quitting drugs. General  instructions Schedule regular health, dental, and eye exams. Stay current with your vaccines. Tell your health care provider if: You often feel depressed. You have ever been abused or do not feel safe at home. Summary Adopting a healthy lifestyle and getting preventive care are important in promoting health and wellness. Follow your health care provider's instructions about healthy diet, exercising, and getting tested or screened for diseases. Follow your health care provider's instructions on monitoring your cholesterol and blood pressure. This information is not intended to replace advice given to you by your health care provider. Make sure you discuss any questions you have with your health care provider. Document Revised: 12/10/2020 Document Reviewed: 12/10/2020 Elsevier Patient Education  2023 Elsevier Inc.  

## 2022-12-03 ENCOUNTER — Ambulatory Visit (INDEPENDENT_AMBULATORY_CARE_PROVIDER_SITE_OTHER): Payer: Medicare Other | Admitting: Family Medicine

## 2022-12-03 ENCOUNTER — Encounter: Payer: Self-pay | Admitting: Family Medicine

## 2022-12-03 VITALS — BP 129/78 | HR 63 | Temp 98.4°F | Ht 65.5 in | Wt 146.4 lb

## 2022-12-03 DIAGNOSIS — Z0000 Encounter for general adult medical examination without abnormal findings: Secondary | ICD-10-CM

## 2022-12-03 LAB — COMPREHENSIVE METABOLIC PANEL
ALT: 13 U/L (ref 0–35)
AST: 17 U/L (ref 0–37)
Albumin: 4.2 g/dL (ref 3.5–5.2)
Alkaline Phosphatase: 88 U/L (ref 39–117)
BUN: 18 mg/dL (ref 6–23)
CO2: 30 mEq/L (ref 19–32)
Calcium: 9 mg/dL (ref 8.4–10.5)
Chloride: 104 mEq/L (ref 96–112)
Creatinine, Ser: 0.97 mg/dL (ref 0.40–1.20)
GFR: 57.83 mL/min — ABNORMAL LOW (ref >60.00)
Glucose, Bld: 86 mg/dL (ref 70–99)
Potassium: 4 mEq/L (ref 3.5–5.1)
Sodium: 141 mEq/L (ref 135–145)
Total Bilirubin: 0.7 mg/dL (ref 0.2–1.2)
Total Protein: 6.4 g/dL (ref 6.0–8.3)

## 2022-12-03 LAB — CBC WITH DIFFERENTIAL/PLATELET
Basophils Absolute: 0 10*3/uL (ref 0.0–0.1)
Basophils Relative: 0.7 % (ref 0.0–3.0)
Eosinophils Absolute: 0.1 10*3/uL (ref 0.0–0.7)
Eosinophils Relative: 2.3 % (ref 0.0–5.0)
HCT: 38.5 % (ref 36.0–46.0)
Hemoglobin: 12.7 g/dL (ref 12.0–15.0)
Lymphocytes Relative: 33 % (ref 12.0–46.0)
Lymphs Abs: 2 10*3/uL (ref 0.7–4.0)
MCHC: 33.1 g/dL (ref 30.0–36.0)
MCV: 83.8 fl (ref 78.0–100.0)
Monocytes Absolute: 0.4 10*3/uL (ref 0.1–1.0)
Monocytes Relative: 5.7 % (ref 3.0–12.0)
Neutro Abs: 3.6 10*3/uL (ref 1.4–7.7)
Neutrophils Relative %: 58.3 % (ref 43.0–77.0)
Platelets: 203 10*3/uL (ref 150.0–400.0)
RBC: 4.59 Mil/uL (ref 3.87–5.11)
RDW: 14.3 % (ref 11.5–15.5)
WBC: 6.2 10*3/uL (ref 4.0–10.5)

## 2022-12-03 LAB — LIPID PANEL
Cholesterol: 199 mg/dL (ref 0–200)
HDL: 69.2 mg/dL (ref >39.00)
LDL Cholesterol: 104 mg/dL — ABNORMAL HIGH (ref 0–99)
NonHDL: 129.98
Total CHOL/HDL Ratio: 3
Triglycerides: 132 mg/dL (ref 0.0–149.0)
VLDL: 26.4 mg/dL (ref 0.0–40.0)

## 2022-12-03 MED ORDER — BUPROPION HCL ER (XL) 300 MG PO TB24: 300.0000 mg | ORAL_TABLET | Freq: Every day | ORAL | 3 refills | Status: DC

## 2022-12-03 NOTE — Progress Notes (Signed)
Office Note 12/03/2022  CC:  Chief Complaint  Patient presents with   Annual Exam    Pt is fasting    HPI:  Patient is a 72 y.o. female who is here for annual health maintenance exam and follow-up history of major depressive disorder. Katelyn Cuevas feels well. She is active and eat healthy.  Mood and anxiety levels stable, continues to take her Wellbutrin daily.  Past Medical History:  Diagnosis Date   Depression    GERD (gastroesophageal reflux disease)    History of adenomatous polyp of colon    History of hiatal hernia    History of vitamin D deficiency    pt denies   Hypercholesterolemia 2020/2021   statin recommended 2021   Osteoarthritis    Multiple joints; left THR, right TKA   Osteoporosis 09/2014 DEXA   Worsening osteoporosis R hip.  Pt declined trial of bisphosphonate from GYN. Rpt DEXA at GYN 2020 "improved" per pt report.   Paraesophageal hiatal hernia    Surgically repaired 09/2014   Urinary tract infection    hx of     Past Surgical History:  Procedure Laterality Date   COLONOSCOPY  09/16/2018   repeat 5 years, tubular adenomas   COLONOSCOPY W/ POLYPECTOMY  Multiple.  Most recent 04/15/13.    2 adenomas; repeat 5 yrs (Dr. Matthias Hughs)   DEXA  12/16/2016   T-score -2.5 (improved)-Dr. McComb.  Repeat 3 yrs.   EYE SURGERY Right 11/2016   cataract    HIATAL HERNIA REPAIR N/A 09/27/2014   Procedure: Laparoscopic takedown of large type III hiatus hernia and Nissen fundoplication over #56 dilator;  Surgeon: Valarie Merino, MD;  Location: [facility];  Service: General;  Laterality: N/A;   PARS PLANA VITRECTOMY W/ REPAIR OF MACULAR HOLE  07/21/2016   TOTAL HIP ARTHROPLASTY  2008   left (Dr. Thurston Hole)   TOTAL KNEE ARTHROPLASTY  11/2013   Right.  [facility]: Dr. Shella Spearing   VAGINAL HYSTERECTOMY  1991   fibroids (both ovaries still in)    Family History  Problem Relation Age of Onset   Emphysema Mother    Stroke Father    Hypertension Father    Hyperlipidemia Father     Heart disease Sister        Afib   Retinal detachment Brother    Cancer Sister        bladder    Social History   Socioeconomic History   Marital status: Married    Spouse name: Not on file   Number of children: Not on file   Years of education: Not on file   Highest education level: Not on file  Occupational History   Not on file  Tobacco Use   Smoking status: Never   Smokeless tobacco: Never  Vaping Use   Vaping Use: Never used  Substance and Sexual Activity   Alcohol use: No   Drug use: No   Sexual activity: Not on file  Other Topics Concern   Not on file  Social History Narrative   ** Merged History Encounter **        Married, 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     Social Determinants of Health   Financial Resource Strain: Low Risk  (12/31/2021)   Overall Financial Resource Strain (CARDIA)    Difficulty of Paying Living Expenses: Not hard at all  Food Insecurity: No Food Insecurity (12/31/2021)   Hunger Vital Sign    Worried About Running Out of Food in  the Last Year: Never true    Ran Out of Food in the Last Year: Never true  Transportation Needs: No Transportation Needs (12/31/2021)   PRAPARE - Administrator, Civil Service (Medical): No    Lack of Transportation (Non-Medical): No  Physical Activity: Insufficiently Active (12/31/2021)   Exercise Vital Sign    Days of Exercise per Week: 3 days    Minutes of Exercise per Session: 30 min  Stress: No Stress Concern Present (12/31/2021)   [employer] of Occupational Health - Occupational Stress Questionnaire    Feeling of Stress : Not at all  Social Connections: Socially Integrated (12/31/2021)   Social Connection and Isolation Panel [NHANES]    Frequency of Communication with Friends and Family: Twice a week    Frequency of Social Gatherings with Friends and Family: Twice a week    Attends Religious Services: More than 4 times per year    Active Member of [employer] or  Organizations: Yes    Attends Engineer, structural: More than 4 times per year    Marital Status: Married  Catering manager Violence: Not At Risk (12/31/2021)   Humiliation, Afraid, Rape, and Kick questionnaire    Fear of Current or Ex-Partner: No    Emotionally Abused: No    Physically Abused: No    Sexually Abused: No    Outpatient Medications Prior to Visit  Medication Sig Dispense Refill   buPROPion (WELLBUTRIN XL) 300 MG 24 hr tablet Take 300 mg by mouth daily.     naproxen (NAPROSYN) 500 MG tablet TAKE 1 TABLET BY MOUTH 2 TIMES DAILY WITH A MEAL. (Patient not taking: Reported on 12/03/2022) 60 tablet 5   nitrofurantoin, macrocrystal-monohydrate, (MACROBID) 100 MG capsule Take 1 capsule (100 mg total) by mouth 2 (two) times daily. (Patient not taking: Reported on 12/31/2021) 6 capsule 0   triamcinolone cream (KENALOG) 0.1 % Apply 1 application topically 2 (two) times daily. (Patient not taking: Reported on 12/03/2022) 30 g 0   No facility-administered medications prior to visit.    No Known Allergies  Review of Systems  Constitutional:  Negative for appetite change, chills, fatigue and fever.  HENT:  Negative for congestion, dental problem, ear pain and sore throat.   Eyes:  Negative for discharge, redness and visual disturbance.  Respiratory:  Negative for cough, chest tightness, shortness of breath and wheezing.   Cardiovascular:  Negative for chest pain, palpitations and leg swelling.  Gastrointestinal:  Negative for abdominal pain, blood in stool, diarrhea, nausea and vomiting.  Genitourinary:  Negative for difficulty urinating, dysuria, flank pain, frequency, hematuria and urgency.  Musculoskeletal:  Negative for arthralgias, back pain, joint swelling, myalgias and neck stiffness.  Skin:  Negative for pallor and rash.  Neurological:  Negative for dizziness, speech difficulty, weakness and headaches.  Hematological:  Negative for adenopathy. Does not bruise/bleed easily.   Psychiatric/Behavioral:  Negative for confusion and sleep disturbance. The patient is not nervous/anxious.     PE;    12/03/2022    9:12 AM 12/25/2021    4:06 PM 11/22/2021    9:51 AM  Vitals with BMI  Height 5' 5.5" 5' 5.5" 5' 5.5"  Weight 146 lbs 6 oz 153 lbs 3 oz 149 lbs 10 oz  BMI 23.98 25.1 24.51  Systolic 129 118 604  Diastolic 78 74 68  Pulse 63 76 70    Exam chaperoned by Emi Holes, CMA. Gen: Alert, well appearing.  Patient is oriented to person, place, time,  and situation. AFFECT: pleasant, lucid thought and speech. ENT: Ears: EACs clear, normal epithelium.  TMs with good light reflex and landmarks bilaterally.  Eyes: no injection, icteris, swelling, or exudate.  EOMI, PERRLA. Nose: no drainage or turbinate edema/swelling.  No injection or focal lesion.  Mouth: lips without lesion/swelling.  Oral mucosa pink and moist.  Dentition intact and without obvious caries or gingival swelling.  Oropharynx without erythema, exudate, or swelling.  Neck: supple/nontender.  No LAD, mass, or TM.  Carotid pulses 2+ bilaterally, without bruits. CV: RRR, no m/r/g.   LUNGS: CTA bilat, nonlabored resps, good aeration in all lung fields. ABD: soft, NT, ND, BS normal.  No hepatospenomegaly or mass.  No bruits. EXT: no clubbing, cyanosis, or edema.  Musculoskeletal: no joint swelling, erythema, warmth, or tenderness.  ROM of all joints intact. Skin - no sores or suspicious lesions or rashes or color changes  Pertinent labs:  Lab Results  Component Value Date   TSH 1.26 05/25/2019   Lab Results  Component Value Date   WBC 6.1 11/22/2021   HGB 12.6 11/22/2021   HCT 38.5 11/22/2021   MCV 83.9 11/22/2021   PLT 200.0 11/22/2021   Lab Results  Component Value Date   CREATININE 0.98 11/22/2021   BUN 14 11/22/2021   NA 142 11/22/2021   K 4.3 11/22/2021   CL 106 11/22/2021   CO2 29 11/22/2021   Lab Results  Component Value Date   ALT 15 11/22/2021   AST 18 11/22/2021   ALKPHOS  90 11/22/2021   BILITOT 0.6 11/22/2021   Lab Results  Component Value Date   CHOL 220 (H) 11/22/2021   Lab Results  Component Value Date   HDL 71.30 11/22/2021   Lab Results  Component Value Date   LDLCALC 129 (H) 11/22/2021   Lab Results  Component Value Date   TRIG 96.0 11/22/2021   Lab Results  Component Value Date   CHOLHDL 3 11/22/2021    ASSESSMENT AND PLAN:   Health maintenance exam: Reviewed age and gender appropriate health maintenance issues (prudent diet, regular exercise, health risks of tobacco and excessive alcohol, use of seatbelts, fire alarms in home, use of sunscreen).  Also reviewed age and gender appropriate health screening as well as vaccine recommendations. Vaccines: ALL UTD. Labs: CMET, CBC, FLP ordered. Cervical ca screening: hx of hysterectomy for benign dx, followed by GYN MD, no further paps. Breast ca screening: gets annual mammogram via her GYN office (next due 07/2023). Colon ca screening: Hx of adenomas->next colonoscopy due 2025. Osteoporosis screening: had DEXA 2020 at [facility]YN [facility], per pt this showed stable bone density.   She is doing well on Wellbutrin XL long-term for history of anxiety and depression. She wishes to continue this indefinitely.   An After Visit Summary was printed and given to the patient.  FOLLOW UP:  Return in about 1 year (around 12/03/2023) for annual CPE (fasting).  Signed:  Santiago Bumpers, MD           12/03/2022

## 2023-02-25 ENCOUNTER — Ambulatory Visit (INDEPENDENT_AMBULATORY_CARE_PROVIDER_SITE_OTHER): Payer: Medicare Other

## 2023-02-25 VITALS — Wt 146.0 lb

## 2023-02-25 DIAGNOSIS — Z0000 Encounter for general adult medical examination without abnormal findings: Secondary | ICD-10-CM | POA: Diagnosis not present

## 2023-02-25 NOTE — Patient Instructions (Signed)
Katelyn Cuevas , Thank you for taking time to come for your Medicare Wellness Visit. I appreciate your ongoing commitment to your health goals. Please review the following plan we discussed and let me know if I can assist you in the future.   These are the goals we discussed:  Goals      Patient Stated     Maintain current health        This is a list of the screening recommended for you and due dates:  Health Maintenance  Topic Date Due   COVID-19 Vaccine (4 - 2023-24 season) 04/04/2022   Flu Shot  03/05/2023   Mammogram  07/15/2023   Colon Cancer Screening  09/17/2023   Medicare Annual Wellness Visit  02/25/2024   DTaP/Tdap/Td vaccine (2 - Td or Tdap) 06/13/2024   Pneumonia Vaccine  Completed   DEXA scan (bone density measurement)  Completed   Hepatitis C Screening  Completed   Zoster (Shingles) Vaccine  Completed   HPV Vaccine  Aged Out    Advanced directives: Please bring a copy of your health care power of attorney and living will to the office at your convenience.  Conditions/risks identified: continue to maintain health   Next appointment: Follow up in one year for your annual wellness visit    Preventive Care 65 Years and Older, Female Preventive care refers to lifestyle choices and visits with your health care provider that can promote health and wellness. What does preventive care include? A yearly physical exam. This is also called an annual well check. Dental exams once or twice a year. Routine eye exams. Ask your health care provider how often you should have your eyes checked. Personal lifestyle choices, including: Daily care of your teeth and gums. Regular physical activity. Eating a healthy diet. Avoiding tobacco and drug use. Limiting alcohol use. Practicing safe sex. Taking low-dose aspirin every day. Taking vitamin and mineral supplements as recommended by your health care provider. What happens during an annual well check? The services and  screenings done by your health care provider during your annual well check will depend on your age, overall health, lifestyle risk factors, and family history of disease. Counseling  Your health care provider may ask you questions about your: Alcohol use. Tobacco use. Drug use. Emotional well-being. Home and relationship well-being. Sexual activity. Eating habits. History of falls. Memory and ability to understand (cognition). Work and work Astronomer. Reproductive health. Screening  You may have the following tests or measurements: Height, weight, and BMI. Blood pressure. Lipid and cholesterol levels. These may be checked every 5 years, or more frequently if you are over 50 years old. Skin check. Lung cancer screening. You may have this screening every year starting at age 60 if you have a 30-pack-year history of smoking and currently smoke or have quit within the past 15 years. Fecal occult blood test (FOBT) of the stool. You may have this test every year starting at age 14. Flexible sigmoidoscopy or colonoscopy. You may have a sigmoidoscopy every 5 years or a colonoscopy every 10 years starting at age 58. Hepatitis C blood test. Hepatitis B blood test. Sexually transmitted disease (STD) testing. Diabetes screening. This is done by checking your blood sugar (glucose) after you have not eaten for a while (fasting). You may have this done every 1-3 years. Bone density scan. This is done to screen for osteoporosis. You may have this done starting at age 53. Mammogram. This may be done every 1-2 years. Talk to  your health care provider about how often you should have regular mammograms. Talk with your health care provider about your test results, treatment options, and if necessary, the need for more tests. Vaccines  Your health care provider may recommend certain vaccines, such as: Influenza vaccine. This is recommended every year. Tetanus, diphtheria, and acellular pertussis (Tdap,  Td) vaccine. You may need a Td booster every 10 years. Zoster vaccine. You may need this after age 37. Pneumococcal 13-valent conjugate (PCV13) vaccine. One dose is recommended after age 34. Pneumococcal polysaccharide (PPSV23) vaccine. One dose is recommended after age 54. Talk to your health care provider about which screenings and vaccines you need and how often you need them. This information is not intended to replace advice given to you by your health care provider. Make sure you discuss any questions you have with your health care provider. Document Released: 08/17/2015 Document Revised: 04/09/2016 Document Reviewed: 05/22/2015 Elsevier Interactive Patient Education  2017 [employer].  Fall Prevention in the Home Falls can cause injuries. They can happen to people of all ages. There are many things you can do to make your home safe and to help prevent falls. What can I do on the outside of my home? Regularly fix the edges of walkways and driveways and fix any cracks. Remove anything that might make you trip as you walk through a door, such as a raised step or threshold. Trim any bushes or trees on the path to your home. Use bright outdoor lighting. Clear any walking paths of anything that might make someone trip, such as rocks or tools. Regularly check to see if handrails are loose or broken. Make sure that both sides of any steps have handrails. Any raised decks and porches should have guardrails on the edges. Have any leaves, snow, or ice cleared regularly. Use sand or salt on walking paths during winter. Clean up any spills in your garage right away. This includes oil or grease spills. What can I do in the bathroom? Use night lights. Install grab bars by the toilet and in the tub and shower. Do not use towel bars as grab bars. Use non-skid mats or decals in the tub or shower. If you need to sit down in the shower, use a plastic, non-slip stool. Keep the floor dry. Clean up any  water that spills on the floor as soon as it happens. Remove soap buildup in the tub or shower regularly. Attach bath mats securely with double-sided non-slip rug tape. Do not have throw rugs and other things on the floor that can make you trip. What can I do in the bedroom? Use night lights. Make sure that you have a light by your bed that is easy to reach. Do not use any sheets or blankets that are too big for your bed. They should not hang down onto the floor. Have a firm chair that has side arms. You can use this for support while you get dressed. Do not have throw rugs and other things on the floor that can make you trip. What can I do in the kitchen? Clean up any spills right away. Avoid walking on wet floors. Keep items that you use a lot in easy-to-reach places. If you need to reach something above you, use a strong step stool that has a grab bar. Keep electrical cords out of the way. Do not use floor polish or wax that makes floors slippery. If you must use wax, use non-skid floor wax. Do not  have throw rugs and other things on the floor that can make you trip. What can I do with my stairs? Do not leave any items on the stairs. Make sure that there are handrails on both sides of the stairs and use them. Fix handrails that are broken or loose. Make sure that handrails are as long as the stairways. Check any carpeting to make sure that it is firmly attached to the stairs. Fix any carpet that is loose or worn. Avoid having throw rugs at the top or bottom of the stairs. If you do have throw rugs, attach them to the floor with carpet tape. Make sure that you have a light switch at the top of the stairs and the bottom of the stairs. If you do not have them, ask someone to add them for you. What else can I do to help prevent falls? Wear shoes that: Do not have high heels. Have rubber bottoms. Are comfortable and fit you well. Are closed at the toe. Do not wear sandals. If you use a  stepladder: Make sure that it is fully opened. Do not climb a closed stepladder. Make sure that both sides of the stepladder are locked into place. Ask someone to hold it for you, if possible. Clearly mark and make sure that you can see: Any grab bars or handrails. First and last steps. Where the edge of each step is. Use tools that help you move around (mobility aids) if they are needed. These include: Canes. Walkers. Scooters. Crutches. Turn on the lights when you go into a dark area. Replace any light bulbs as soon as they burn out. Set up your furniture so you have a clear path. Avoid moving your furniture around. If any of your floors are uneven, fix them. If there are any pets around you, be aware of where they are. Review your medicines with your doctor. Some medicines can make you feel dizzy. This can increase your chance of falling. Ask your doctor what other things that you can do to help prevent falls. This information is not intended to replace advice given to you by your health care provider. Make sure you discuss any questions you have with your health care provider. Document Released: 05/17/2009 Document Revised: 12/27/2015 Document Reviewed: 08/25/2014 Elsevier Interactive Patient Education  2017 [employer].

## 2023-02-25 NOTE — Progress Notes (Addendum)
Subjective:   Katelyn Cuevas is a 72 y.o. female who presents for Medicare Annual (Subsequent) preventive examination.  Per patient no change in vitals since last visit; unable to obtain new vitals due to this being a telehealth visit. Visit Complete: Virtual    I connected with  Terrilee Files on 02/25/23 by a audio enabled telemedicine application and verified that I am speaking with the correct person using two identifiers.  Patient Location: Home  Provider Location: Home Office  I discussed the limitations of evaluation and management by telemedicine. The patient expressed understanding and agreed to proceed.   Review of Systems     Cardiac Risk Factors include: advanced age (>20men, >75 women)     Objective:    Today's Vitals   02/25/23 1126  Weight: 146 lb (66.2 kg)   Body mass index is 23.93 kg/m.     02/25/2023   11:33 AM 12/31/2021   10:06 AM 07/04/2020   10:34 AM 10/01/2018    2:11 PM 09/04/2017   11:21 AM 09/01/2016   10:46 AM 09/27/2014    9:57 AM  Advanced Directives  Does Patient Have a Medical Advance Directive? Yes Yes Yes Yes Yes Yes   Type of Estate agent of [place];Living will Healthcare Power of [place];Living will Healthcare Power of [place];Living will Living will;Healthcare Power of [employer] Power of [place];Living will Healthcare Power of [place];Living will   Does patient want to make changes to medical advance directive?       No - Patient declined  Copy of Healthcare Power of Attorney in Chart? No - copy requested No - copy requested Yes - validated most recent copy scanned in chart (See row information) No - copy requested No - copy requested No - copy requested     Current Medications (verified) Outpatient Encounter Medications as of 02/25/2023  Medication Sig   buPROPion (WELLBUTRIN XL) 300 MG 24 hr tablet Take 1 tablet (300 mg total) by mouth daily.   naproxen (NAPROSYN) 500 MG tablet Take  500 mg by mouth 2 (two) times daily.   No facility-administered encounter medications on file as of 02/25/2023.    Allergies (verified) Patient has no known allergies.   History: Past Medical History:  Diagnosis Date   Depression    GERD (gastroesophageal reflux disease)    History of adenomatous polyp of colon    History of hiatal hernia    History of vitamin D deficiency    pt denies   Hypercholesterolemia 2020/2021   statin recommended 2021   Osteoarthritis    Multiple joints; left THR, right TKA   Osteoporosis 09/2014 DEXA   Worsening osteoporosis R hip.  Pt declined trial of bisphosphonate from GYN. Rpt DEXA at GYN 2020 "improved" per pt report.   Paraesophageal hiatal hernia    Surgically repaired 09/2014   Urinary tract infection    hx of    Past Surgical History:  Procedure Laterality Date   COLONOSCOPY  09/16/2018   repeat 5 years, tubular adenomas   COLONOSCOPY W/ POLYPECTOMY  Multiple.  Most recent 04/15/13.    2 adenomas; repeat 5 yrs (Dr. Matthias Hughs)   DEXA  12/16/2016   T-score -2.5 (improved)-Dr. McComb.  Repeat 3 yrs.   EYE SURGERY Right 11/2016   cataract    HIATAL HERNIA REPAIR N/A 09/27/2014   Procedure: Laparoscopic takedown of large type III hiatus hernia and Nissen fundoplication over #56 dilator;  Surgeon: Valarie Merino, MD;  Location: [facility];  Service: General;  Laterality: N/A;   PARS PLANA VITRECTOMY W/ REPAIR OF MACULAR HOLE  07/21/2016   TOTAL HIP ARTHROPLASTY  2008   left (Dr. Thurston Hole)   TOTAL KNEE ARTHROPLASTY  11/2013   Right.  [facility]: Dr. Shella Spearing   VAGINAL HYSTERECTOMY  1991   fibroids (both ovaries still in)   Family History  Problem Relation Age of Onset   Emphysema Mother    Stroke Father    Hypertension Father    Hyperlipidemia Father    Heart disease Sister        Afib   Retinal detachment Brother    Cancer Sister        bladder   Social History   Socioeconomic History   Marital status: Married    Spouse name: Not on file    Number of children: Not on file   Years of education: Not on file   Highest education level: Not on file  Occupational History   Not on file  Tobacco Use   Smoking status: Never   Smokeless tobacco: Never  Vaping Use   Vaping status: Never Used  Substance and Sexual Activity   Alcohol use: No   Drug use: No   Sexual activity: Not on file  Other Topics Concern   Not on file  Social History Narrative   ** Merged History Encounter **        Married, 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     Social Determinants of Health   Financial Resource Strain: Low Risk  (02/25/2023)   Overall Financial Resource Strain (CARDIA)    Difficulty of Paying Living Expenses: Not hard at all  Food Insecurity: No Food Insecurity (02/25/2023)   Hunger Vital Sign    Worried About Running Out of Food in the Last Year: Never true    Ran Out of Food in the Last Year: Never true  Transportation Needs: No Transportation Needs (02/25/2023)   PRAPARE - Administrator, Civil Service (Medical): No    Lack of Transportation (Non-Medical): No  Physical Activity: Sufficiently Active (02/25/2023)   Exercise Vital Sign    Days of Exercise per Week: 7 days    Minutes of Exercise per Session: 60 min  Stress: No Stress Concern Present (02/25/2023)   [employer] of Occupational Health - Occupational Stress Questionnaire    Feeling of Stress : Not at all  Social Connections: Socially Integrated (02/25/2023)   Social Connection and Isolation Panel [NHANES]    Frequency of Communication with Friends and Family: More than three times a week    Frequency of Social Gatherings with Friends and Family: More than three times a week    Attends Religious Services: More than 4 times per year    Active Member of [employer] or Organizations: Yes    Attends Banker Meetings: 1 to 4 times per year    Marital Status: Married    Tobacco Counseling Counseling given: Not  Answered   Clinical Intake:  Pre-visit preparation completed: Yes  Pain : No/denies pain     BMI - recorded: 23.93 Nutritional Status: BMI of 19-24  Normal Nutritional Risks: None Diabetes: No  How often do you need to have someone help you when you read instructions, pamphlets, or other written materials from your doctor or pharmacy?: 1 - Never  Interpreter Needed?: No  Information entered by :: Lanier Ensign, LPN   Activities of Daily Living    02/25/2023   11:29  AM  In your present state of health, do you have any difficulty performing the following activities:  Hearing? 0  Vision? 0  Difficulty concentrating or making decisions? 0  Walking or climbing stairs? 0  Dressing or bathing? 0  Doing errands, shopping? 0  Preparing Food and eating ? N  Using the Toilet? N  In the past six months, have you accidently leaked urine? N  Do you have problems with loss of bowel control? N  Managing your Medications? N  Managing your Finances? N  Housekeeping or managing your Housekeeping? N    Patient Care Team: Jeoffrey Massed, MD as PCP - General (Family Medicine) Bernette Redbird, MD as Consulting Physician (Gastroenterology) Luretha Murphy, MD as Consulting Physician (General Surgery) Dermatology, [facility], MD as Referring Physician (Orthopedic Surgery) Richardean Chimera, MD as Consulting Physician (Obstetrics and Gynecology) Marzella Schlein., MD (Ophthalmology)  Indicate any recent Medical Services you may have received from other than [facility] providers in the past year (date may be approximate).     Assessment:   This is a routine wellness examination for [place].  Hearing/Vision screen Hearing Screening - Comments:: Pt denies any hearing issues  Vision Screening - Comments:: Pt follows up with Dr Lucretia Roers for annual eye exams   Dietary issues and exercise activities discussed:     Goals Addressed             This Visit's Progress     Patient Stated       Maintain current health        Depression Screen    02/25/2023   11:31 AM 12/03/2022    9:13 AM 12/31/2021   10:07 AM 12/31/2021   10:04 AM 11/22/2021    9:57 AM 07/04/2020   10:36 AM 05/25/2019   10:18 AM  PHQ 2/9 Scores  PHQ - 2 Score 0 0 0 0 0 0 0  PHQ- 9 Score       0    Fall Risk    02/25/2023   11:34 AM 12/03/2022    9:13 AM 12/31/2021   10:06 AM 11/22/2021    9:56 AM 07/04/2020   10:35 AM  Fall Risk   Falls in the past year? 1 0 0 0 0  Number falls in past yr: 1 0 0 0 0  Injury with Fall? 1 0 0 0 0  Comment scraped up by tripping over puppy      Risk for fall due to : Impaired vision No Fall Risks  Orthopedic patient   Follow up Falls prevention discussed Falls evaluation completed Falls evaluation completed Falls evaluation completed Falls prevention discussed    MEDICARE RISK AT HOME:  Medicare Risk at Home - 02/25/23 1134     Any stairs in or around the home? Yes    If so, are there any without handrails? No    Home free of loose throw rugs in walkways, pet beds, electrical cords, etc? Yes    Adequate lighting in your home to reduce risk of falls? Yes    Life alert? No    Use of a cane, walker or w/c? No    Grab bars in the bathroom? No    Shower chair or bench in shower? No    Elevated toilet seat or a handicapped toilet? No             TIMED UP AND GO:  Was the test performed?  No    Cognitive Function:  10/01/2018    2:14 PM  MMSE - Mini Mental State Exam  Orientation to time 5  Orientation to Place 5  Registration 3  Attention/ Calculation 5  Recall 2  Language- name 2 objects 2  Language- repeat 1  Language- follow 3 step command 3  Language- read & follow direction 1  Write a sentence 1  Copy design 1  Total score 29        02/25/2023   11:35 AM  6CIT Screen  What Year? 0 points  What month? 0 points  What time? 0 points  Count back from 20 0 points  Months in reverse 0 points  Repeat phrase 0 points   Total Score 0 points    Immunizations Immunization History  Administered Date(s) Administered   Fluad Quad(high Dose 65+) 05/25/2019, 06/08/2020   Influenza Whole 06/26/2009   Influenza, High Dose Seasonal PF 05/12/2018   Influenza,inj,Quad PF,6+ Mos 05/13/2014   Influenza-Unspecified 05/30/2015, 05/12/2016, 06/03/2017, 05/24/2021, 03/21/2022   PFIZER(Purple Top)SARS-COV-2 Vaccination 09/01/2019, 09/27/2019, 05/22/2020   Pneumococcal Conjugate-13 06/13/2014, 05/30/2015   Pneumococcal Polysaccharide-23 05/12/2016   Tdap 06/13/2014   Zoster Recombinant(Shingrix) 06/07/2019, 11/15/2019   Zoster, Live 07/27/2013    TDAP status: Up to date  Flu Vaccine status: Up to date  Pneumococcal vaccine status: Up to date  Covid-19 vaccine status: Completed vaccines  Qualifies for Shingles Vaccine? Yes   Zostavax completed Yes   Shingrix Completed?: Yes  Screening Tests Health Maintenance  Topic Date Due   COVID-19 Vaccine (4 - 2023-24 season) 04/04/2022   INFLUENZA VACCINE  03/05/2023   MAMMOGRAM  07/15/2023   Colonoscopy  09/17/2023   Medicare Annual Wellness (AWV)  02/25/2024   DTaP/Tdap/Td (2 - Td or Tdap) 06/13/2024   Pneumonia Vaccine 69+ Years old  Completed   DEXA SCAN  Completed   Hepatitis C Screening  Completed   Zoster Vaccines- Shingrix  Completed   HPV VACCINES  Aged Out    Health Maintenance  Health Maintenance Due  Topic Date Due   COVID-19 Vaccine (4 - 2023-24 season) 04/04/2022    Colorectal cancer screening: Type of screening: Colonoscopy. Completed 09/16/18. Repeat every 5 years  Mammogram status: Completed 07/14/22. Repeat every year  Bone Density status: Completed 06/18/21. Results reflect: Bone density results: OSTEOPENIA. Repeat every 2 years.  Additional Screening:  Hepatitis C Screening: Completed 02/16/18  Vision Screening: Recommended annual ophthalmology exams for early detection of glaucoma and other disorders of the eye. Is the patient  up to date with their annual eye exam?  No  Who is the provider or what is the name of the office in which the patient attends annual eye exams? Dr Lucretia Roers  If pt is not established with a provider, would they like to be referred to a provider to establish care? No .   Dental Screening: Recommended annual dental exams for proper oral hygiene    Community Resource Referral / Chronic Care Management: CRR required this visit?  No   CCM required this visit?  No     Plan:     I have personally reviewed and noted the following in the patient's chart:   Medical and social history Use of alcohol, tobacco or illicit drugs  Current medications and supplements including opioid prescriptions. Patient is not currently taking opioid prescriptions. Functional ability and status Nutritional status Physical activity Advanced directives List of other physicians Hospitalizations, surgeries, and ER visits in previous 12 months Vitals Screenings to include cognitive, depression, and falls Referrals  and appointments  In addition, I have reviewed and discussed with patient certain preventive protocols, quality metrics, and best practice recommendations. A written personalized care plan for preventive services as well as general preventive health recommendations were provided to patient.     Marzella Schlein, LPN   11/10/8117   After Visit Summary: (MyChart) Due to this being a telephonic visit, the after visit summary with patients personalized plan was offered to patient via MyChart   Nurse Notes: none

## 2023-04-22 ENCOUNTER — Other Ambulatory Visit: Payer: Self-pay | Admitting: Family Medicine

## 2023-04-22 DIAGNOSIS — M542 Cervicalgia: Secondary | ICD-10-CM

## 2023-06-04 ENCOUNTER — Ambulatory Visit: Payer: Medicare Other | Admitting: Urgent Care

## 2023-06-08 ENCOUNTER — Telehealth: Payer: Self-pay

## 2023-06-08 NOTE — Telephone Encounter (Signed)
[facility] Day - Client Nonclinical Telephone Record  [facility] Client [facility] Day - Client Client Site [facility] [place] - Day Contact Type Call Who Is Calling Patient / Member / Family / Caregiver Caller Name Kaisyn Reinhold Caller Phone Number [phone] Patient Name Katelyn Cuevas Patient DOB [id number] Call Type Message Only Information Provided Reason for Call Request to [facility] Appointment Initial Comment The caller states she called to cancel the appointment Disp. Time Disposition Final User 06/04/2023 12:56:49 PM General Information Provided Yes Back, Jomarie Longs Call Closed By: Crista Luria Transaction Date/Time: 06/04/2023 12:53:58 PM (ET)

## 2023-06-16 ENCOUNTER — Telehealth: Payer: Self-pay | Admitting: Family Medicine

## 2023-06-16 NOTE — Telephone Encounter (Signed)
Patient called to request picking up a copy of her Healthcare power of [address]. I informed her I was not positive this was something she could pick up from the office, but would have someone give her a call and confirm. She was transferred to medical records since I know a copy should be in her chart. If someone could give Katelyn Cuevas a call to confirm if she can pick up a copy in office.

## 2023-06-17 NOTE — Telephone Encounter (Signed)
Spoke with pt regarding request for [employer] [place]. I informed the pt that we did not have this document to provide to her and advised her to contact her lawyer. Pt understood and had no further questions.

## 2023-06-17 NOTE — Telephone Encounter (Signed)
Patient returned call. Please call back

## 2023-06-17 NOTE — Telephone Encounter (Signed)
LVM to discuss

## 2023-09-01 DIAGNOSIS — Z1231 Encounter for screening mammogram for malignant neoplasm of breast: Secondary | ICD-10-CM | POA: Diagnosis not present

## 2023-09-01 DIAGNOSIS — Z6825 Body mass index (BMI) 25.0-25.9, adult: Secondary | ICD-10-CM | POA: Diagnosis not present

## 2023-09-01 DIAGNOSIS — M816 Localized osteoporosis [Lequesne]: Secondary | ICD-10-CM | POA: Diagnosis not present

## 2023-09-01 DIAGNOSIS — Z01419 Encounter for gynecological examination (general) (routine) without abnormal findings: Secondary | ICD-10-CM | POA: Diagnosis not present

## 2023-09-01 LAB — HM DEXA SCAN

## 2023-09-03 LAB — HM MAMMOGRAPHY

## 2023-09-16 ENCOUNTER — Telehealth: Payer: Self-pay

## 2023-09-16 NOTE — Telephone Encounter (Signed)
Pt called and she confirmed she only wanted [place] to update her pharmacy. Pharmacy has been updated

## 2023-09-16 NOTE — Telephone Encounter (Signed)
Message from patient via mychart. I am not sure if she is requesting refill or just updating pharmacy mail order info. Please call patient. Thank you, [name]  Thank you for scheduling my appointment! I have a new insurance -[employer] [id number]. I will bring my card at my appointment . We were having phone difficulties and I was wanted to provide this info as Care Katelyn Cuevas [phone] needs your office to schedule my prescriptions to be mailed. They need you to call [phone] with instructions for mail delivery. Naproxen and Bupropion. Thank you! Call if you need further info.

## 2023-09-21 DIAGNOSIS — M818 Other osteoporosis without current pathological fracture: Secondary | ICD-10-CM | POA: Diagnosis not present

## 2023-10-29 NOTE — Telephone Encounter (Signed)
 Records requested, HM updated BC screening 09/01/23

## 2023-11-02 ENCOUNTER — Encounter: Payer: Self-pay | Admitting: Family Medicine

## 2023-11-03 DIAGNOSIS — K5730 Diverticulosis of large intestine without perforation or abscess without bleeding: Secondary | ICD-10-CM | POA: Diagnosis not present

## 2023-11-03 DIAGNOSIS — D120 Benign neoplasm of cecum: Secondary | ICD-10-CM | POA: Diagnosis not present

## 2023-11-03 DIAGNOSIS — Z860101 Personal history of adenomatous and serrated colon polyps: Secondary | ICD-10-CM | POA: Diagnosis not present

## 2023-11-03 DIAGNOSIS — Z09 Encounter for follow-up examination after completed treatment for conditions other than malignant neoplasm: Secondary | ICD-10-CM | POA: Diagnosis not present

## 2023-11-03 LAB — HM COLONOSCOPY

## 2023-11-05 DIAGNOSIS — D120 Benign neoplasm of cecum: Secondary | ICD-10-CM | POA: Diagnosis not present

## 2023-11-23 DIAGNOSIS — L578 Other skin changes due to chronic exposure to nonionizing radiation: Secondary | ICD-10-CM | POA: Diagnosis not present

## 2023-11-23 DIAGNOSIS — L570 Actinic keratosis: Secondary | ICD-10-CM | POA: Diagnosis not present

## 2023-11-23 DIAGNOSIS — D225 Melanocytic nevi of trunk: Secondary | ICD-10-CM | POA: Diagnosis not present

## 2023-11-23 DIAGNOSIS — L820 Inflamed seborrheic keratosis: Secondary | ICD-10-CM | POA: Diagnosis not present

## 2023-11-23 DIAGNOSIS — W908XXS Exposure to other nonionizing radiation, sequela: Secondary | ICD-10-CM | POA: Diagnosis not present

## 2023-11-23 DIAGNOSIS — L821 Other seborrheic keratosis: Secondary | ICD-10-CM | POA: Diagnosis not present

## 2023-11-23 DIAGNOSIS — L308 Other specified dermatitis: Secondary | ICD-10-CM | POA: Diagnosis not present

## 2023-12-07 ENCOUNTER — Other Ambulatory Visit: Payer: Self-pay

## 2023-12-07 MED ORDER — BUPROPION HCL ER (XL) 300 MG PO TB24: 300.0000 mg | ORAL_TABLET | Freq: Every day | ORAL | 0 refills | Status: AC

## 2023-12-08 ENCOUNTER — Telehealth: Payer: Self-pay

## 2023-12-08 NOTE — Telephone Encounter (Signed)
 Copied from CRM [id number]. Topic: Clinical - Medication Question >> Dec 03, 2023  9:36 AM Allyne Areola wrote: Reason for CRM: Patient updated her insurance and is currently under [employer], per patient when she tried to fill her prescription for buPROPion  (WELLBUTRIN  XL) 300 MG 24 hr tablet they advised they could not fill and will be contacting the providers office. I asked if they required a PA but she is unsure.

## 2023-12-09 ENCOUNTER — Other Ambulatory Visit (HOSPITAL_COMMUNITY): Payer: Self-pay

## 2023-12-09 ENCOUNTER — Telehealth: Payer: Self-pay

## 2023-12-09 NOTE — Telephone Encounter (Signed)
 A user error has taken place: orders placed in error, not carried out on this patient.

## 2023-12-09 NOTE — Telephone Encounter (Signed)
 noted

## 2023-12-09 NOTE — Telephone Encounter (Signed)
 Pharmacy Patient Advocate Encounter   Received notification from Physician's Office that prior authorization for for buPROPion  (WELLBUTRIN  XL) 300 MG 24 hr TABLETS    Insurance verification completed.   The patient is insured through [employer].  Action: Refill too soon. PA is not needed at this time. Medication was filled 12/07/2023. Next eligible fill date is 02/13/2024.

## 2024-02-23 ENCOUNTER — Other Ambulatory Visit: Payer: Self-pay

## 2024-02-24 ENCOUNTER — Other Ambulatory Visit: Payer: Self-pay

## 2024-02-29 ENCOUNTER — Encounter: Payer: Medicare Other | Admitting: Family Medicine

## 2024-03-02 ENCOUNTER — Ambulatory Visit (INDEPENDENT_AMBULATORY_CARE_PROVIDER_SITE_OTHER): Payer: Medicare Other

## 2024-03-02 VITALS — Ht 65.5 in | Wt 146.0 lb

## 2024-03-02 DIAGNOSIS — Z0000 Encounter for general adult medical examination without abnormal findings: Secondary | ICD-10-CM

## 2024-03-02 NOTE — Progress Notes (Signed)
 Subjective:   Katelyn Cuevas is a 73 y.o. who presents for a Medicare Wellness preventive visit.  As a reminder, Annual Wellness Visits don't include a physical exam, and some assessments may be limited, especially if this visit is performed virtually. We may recommend an in-person follow-up visit with your provider if needed.  Visit Complete: Virtual I connected with  Katelyn Cuevas on 03/02/24 by a audio enabled telemedicine application and verified that I am speaking with the correct person using two identifiers.  Patient Location: Home  Provider Location: Home Office  I discussed the limitations of evaluation and management by telemedicine. The patient expressed understanding and agreed to proceed.  Vital Signs: Because this visit was a virtual/telehealth visit, some criteria may be missing or patient reported. Any vitals not documented were not able to be obtained and vitals that have been documented are patient reported.  VideoDeclined- This patient declined Librarian, academic. Therefore the visit was completed with audio only.  Persons Participating in Visit: Patient.  AWV Questionnaire: No: Patient Medicare AWV questionnaire was not completed prior to this visit.  Cardiac Risk Factors include: advanced age (>35men, >15 women);Other (see comment), Risk factor comments: Osteopenia     Objective:    Today's Vitals   03/02/24 1044  Weight: 146 lb (66.2 kg)  Height: 5' 5.5 (1.664 m)   Body mass index is 23.93 kg/m.     03/02/2024   10:50 AM 02/25/2023   11:33 AM 12/31/2021   10:06 AM 07/04/2020   10:34 AM 10/01/2018    2:11 PM 09/04/2017   11:21 AM 09/01/2016   10:46 AM  Advanced Directives  Does Patient Have a Medical Advance Directive? Yes Yes Yes Yes Yes  Yes  Yes   Type of Estate agent of Katelyn Cuevas;Living will Healthcare Power of Katelyn Cuevas;Living will Healthcare Power of Katelyn Cuevas;Living will Healthcare Power  of Katelyn Cuevas;Living will Living will;Healthcare Power of Katelyn Cuevas Power of Katelyn Cuevas;Living will Healthcare Power of Katelyn Cuevas;Living will  Copy of Healthcare Power of Attorney in Chart? No - copy requested No - copy requested No - copy requested Yes - validated most recent copy scanned in chart (See row information) No - copy requested  No - copy requested  No - copy requested      Data saved with a previous flowsheet row definition    Current Medications (verified) Outpatient Encounter Medications as of 03/02/2024  Medication Sig   buPROPion  (WELLBUTRIN  XL) 300 MG 24 hr tablet Take 1 tablet (300 mg total) by mouth daily.   naproxen  (NAPROSYN ) 500 MG tablet TAKE 1 TABLET BY MOUTH TWICE A DAY WITH FOOD (Patient taking differently: Take 500 mg by mouth as needed.)   tretinoin (RETIN-A) 0.05 % cream Apply topically at bedtime.   No facility-administered encounter medications on file as of 03/02/2024.    Allergies (verified) Patient has no known allergies.   History: Past Medical History:  Diagnosis Date   Depression    GERD (gastroesophageal reflux disease)    History of adenomatous polyp of colon    History of hiatal hernia    History of vitamin D deficiency    pt denies   Hypercholesterolemia 2020/2021   statin recommended 2021   Osteoarthritis    Multiple joints; left THR, right TKA   Osteoporosis 09/2014 DEXA   Worsening osteoporosis R hip.  Pt declined trial of bisphosphonate from GYN. Rpt DEXA at GYN 2020 improved per pt report. 08/2023 DEXA T score -2.5.  Paraesophageal hiatal hernia    Surgically repaired 09/2014   Urinary tract infection    hx of    Past Surgical History:  Procedure Laterality Date   COLONOSCOPY  09/16/2018   repeat 5 years, tubular adenomas   COLONOSCOPY W/ POLYPECTOMY  Multiple.  Most recent 04/15/13.    2 adenomas; repeat 5 yrs (Dr. Donnald)   DEXA  12/16/2016   T-score -2.5 (improved)-Dr. McComb.  08/2023 unchanged.   EYE SURGERY Right  11/2016   cataract    HIATAL HERNIA REPAIR N/A 09/27/2014   Procedure: Laparoscopic takedown of large type III hiatus hernia and Nissen fundoplication over #56 dilator;  Surgeon: Katelyn Cuevas Katelyn Lunger, MD;  Location: [facility];  Service: General;  Laterality: N/A;   PARS PLANA VITRECTOMY W/ REPAIR OF MACULAR HOLE  07/21/2016   TOTAL HIP ARTHROPLASTY  2008   left (Dr. Jane)   TOTAL KNEE ARTHROPLASTY  11/2013   Right.  [facility]: Dr. Dava   VAGINAL HYSTERECTOMY  1991   fibroids (both ovaries still in)   Family History  Problem Relation Age of Onset   Emphysema Mother    Stroke Father    Hypertension Father    Hyperlipidemia Father    Heart disease Sister        Afib   Retinal detachment Brother    Cancer Sister        bladder   Social History   Socioeconomic History   Marital status: Married    Spouse name: Katelyn Cuevas   Number of children: 2   Years of education: Not on file   Highest education level: Not on file  Occupational History   Occupation: RETIRED  Tobacco Use   Smoking status: Never   Smokeless tobacco: Never  Vaping Use   Vaping status: Never Used  Substance and Sexual Activity   Alcohol use: No   Drug use: No   Sexual activity: Not on file  Other Topics Concern   Not on file  Social History Narrative   ** Merged History Encounter **  Married, 2 children.   Occupation: real estate agent   No T/A/Ds.      Lives with husband and has 1 dog/2025   Social Drivers of Health   Financial Resource Strain: Low Risk  (03/02/2024)   Overall Financial Resource Strain (CARDIA)    Difficulty of Paying Living Expenses: Not very hard  Food Insecurity: No Food Insecurity (03/02/2024)   Hunger Vital Sign    Worried About Running Out of Food in the Last Year: Never true    Ran Out of Food in the Last Year: Never true  Transportation Needs: No Transportation Needs (03/02/2024)   PRAPARE - Administrator, Civil Service (Medical): No    Lack of Transportation  (Non-Medical): No  Physical Activity: Inactive (03/02/2024)   Exercise Vital Sign    Days of Exercise per Week: 0 days    Minutes of Exercise per Session: 0 min  Stress: No Stress Concern Present (03/02/2024)   [employer] of Occupational Health - Occupational Stress Questionnaire    Feeling of Stress: Not at all  Social Connections: Socially Integrated (03/02/2024)   Social Connection and Isolation Panel    Frequency of Communication with Friends and Family: More than three times a week    Frequency of Social Gatherings with Friends and Family: Once a week    Attends Religious Services: More than 4 times per year    Active Member of [employer] or Organizations:  Yes    Attends Banker Meetings: Never    Marital Status: Married    Tobacco Counseling Counseling given: Not Answered    Clinical Intake:  Pre-visit preparation completed: Yes  Pain : No/denies pain     BMI - recorded: 23.93 Nutritional Status: BMI of 19-24  Normal Nutritional Risks: None Diabetes: No  No results found for: HGBA1C   How often do you need to have someone help you when you read instructions, pamphlets, or other written materials from your doctor or pharmacy?: 1 - Never  Interpreter Needed?: No  Information entered by :: Jerry Haugen, RMA   Activities of Daily Living     03/02/2024   10:45 AM  In your present Katelyn of health, do you have any difficulty performing the following activities:  Hearing? 0  Vision? 0  Difficulty concentrating or making decisions? 0  Walking or climbing stairs? 0  Dressing or bathing? 0  Doing errands, shopping? 0  Preparing Food and eating ? N  Using the Toilet? N  In the past six months, have you accidently leaked urine? N  Do you have problems with loss of bowel control? N  Managing your Medications? N  Managing your Finances? N  Housekeeping or managing your Housekeeping? N    Patient Care Team: Candise Aleene DEL, MD as PCP - General  (Family Medicine) Donnald Charleston, MD as Consulting Physician (Gastroenterology) Gladis Cough, MD as Consulting Physician (General Surgery) Dermatology, [facility], MD as Referring Physician (Orthopedic Surgery) Leva Rush, MD as Consulting Physician (Obstetrics and Gynecology) Debarah Lorrene DEL., MD (Ophthalmology)  I have updated your Care Teams any recent Medical Services you may have received from other providers in the past year.     Assessment:   This is a routine wellness examination for Katelyn Cuevas.  Hearing/Vision screen Hearing Screening - Comments:: Denies hearing difficulties   Vision Screening - Comments:: Wears eyeglasses for reading/ In focus/Dr. Debarah   Goals Addressed             This Visit's Progress    Patient Stated   On track    Maintain current health       Depression Screen     03/02/2024   10:53 AM 02/25/2023   11:31 AM 12/03/2022    9:13 AM 12/31/2021   10:07 AM 12/31/2021   10:04 AM 11/22/2021    9:57 AM 07/04/2020   10:36 AM  PHQ 2/9 Scores  PHQ - 2 Score 0 0 0 0 0 0 0  PHQ- 9 Score 0          Fall Risk     03/02/2024   10:51 AM 02/25/2023   11:34 AM 12/03/2022    9:13 AM 12/31/2021   10:06 AM 11/22/2021    9:56 AM  Fall Risk   Falls in the past year? 0 1 0 0 0  Number falls in past yr: 0 1 0 0 0  Injury with Fall? 0 1 0 0 0  Comment  scraped up by tripping over puppy     Risk for fall due to :  Impaired vision No Fall Risks  Orthopedic patient  Follow up Falls evaluation completed;Falls prevention discussed Falls prevention discussed Falls evaluation completed Falls evaluation completed  Falls evaluation completed      Data saved with a previous flowsheet row definition    MEDICARE RISK AT HOME:  Medicare Risk at Home Any stairs in or around the home?: Yes (basement  and bonus room) If so, are there any without handrails?: No Home free of loose throw rugs in walkways, pet beds, electrical cords, etc?: Yes Adequate  lighting in your home to reduce risk of falls?: Yes Life alert?: No Use of a cane, walker or w/c?: No Grab bars in the bathroom?: No Shower chair or bench in shower?: No Elevated toilet seat or a handicapped toilet?: No  TIMED UP AND GO:  Was the test performed?  No  Cognitive Function: Declined/Normal: No cognitive concerns noted by patient or family. Patient alert, oriented, able to answer questions appropriately and recall recent events. No signs of memory loss or confusion.    10/01/2018    2:14 PM  MMSE - Mini Mental Katelyn Exam  Orientation to time 5  Orientation to Place 5  Registration 3  Attention/ Calculation 5  Recall 2  Language- name 2 objects 2  Language- repeat 1  Language- follow 3 step command 3  Language- read & follow direction 1  Write a sentence 1  Copy design 1  Total score 29        02/25/2023   11:35 AM  6CIT Screen  What Year? 0 points  What month? 0 points  What time? 0 points  Count back from 20 0 points  Months in reverse 0 points  Repeat phrase 0 points  Total Score 0 points    Immunizations Immunization History  Administered Date(s) Administered   Fluad Quad(high Dose 65+) 05/25/2019, 06/08/2020   Influenza Whole 06/26/2009   Influenza, High Dose Seasonal PF 05/12/2018, 04/23/2023   Influenza,inj,Quad PF,6+ Mos 05/13/2014   Influenza-Unspecified 05/30/2015, 05/12/2016, 06/03/2017, 05/24/2021, 03/21/2022   PFIZER(Purple Top)SARS-COV-2 Vaccination 09/01/2019, 09/27/2019, 05/22/2020   Pneumococcal Conjugate-13 06/13/2014, 05/30/2015   Pneumococcal Polysaccharide-23 05/12/2016   Respiratory Syncytial Virus Vaccine,Recomb Aduvanted(Arexvy) 06/05/2023   Tdap 06/13/2014   Zoster Recombinant(Shingrix) 06/07/2019, 11/15/2019   Zoster, Live 07/27/2013    Screening Tests Health Maintenance  Topic Date Due   COVID-19 Vaccine (4 - 2024-25 season) 03/12/2024 (Originally 04/05/2023)   INFLUENZA VACCINE  03/04/2024   DTaP/Tdap/Td (2 - Td or  Tdap) 06/13/2024   MAMMOGRAM  09/02/2024   Medicare Annual Wellness (AWV)  03/02/2025   Colonoscopy  11/02/2028   Pneumococcal Vaccine: 50+ Years  Completed   DEXA SCAN  Completed   Hepatitis C Screening  Completed   Zoster Vaccines- Shingrix  Completed   Hepatitis B Vaccines  Aged Out   HPV VACCINES  Aged Out   Meningococcal B Vaccine  Aged Out    Health Maintenance  There are no preventive care reminders to display for this patient. Health Maintenance Items Addressed: See Nurse Notes at the end of this note  Additional Screening:  Vision Screening: Recommended annual ophthalmology exams for early detection of glaucoma and other disorders of the eye. Would you like a referral to an eye doctor? No    Dental Screening: Recommended annual dental exams for proper oral hygiene  Community Resource Referral / Chronic Care Management: CRR required this visit?  No   CCM required this visit?  No   Plan:    I have personally reviewed and noted the following in the patient's chart:   Medical and social history Use of alcohol, tobacco or illicit drugs  Current medications and supplements including opioid prescriptions. Patient is not currently taking opioid prescriptions. Functional ability and status Nutritional status Physical activity Advanced directives List of other physicians Hospitalizations, surgeries, and ER visits in previous 12 months Vitals Screenings  to include cognitive, depression, and falls Referrals and appointments  In addition, I have reviewed and discussed with patient certain preventive protocols, quality metrics, and best practice recommendations. A written personalized care plan for preventive services as well as general preventive health recommendations were provided to patient.   Oluwaseun Bruyere L Qianna Clagett, CMA   03/02/2024   After Visit Summary: (MyChart) Due to this being a telephonic visit, the after visit summary with patients personalized plan was offered  to patient via MyChart   Notes: Patient is up to date on all health maintenance with no concerns to address today.

## 2024-03-02 NOTE — Patient Instructions (Signed)
 Katelyn Cuevas , Thank you for taking time out of your busy schedule to complete your Annual Wellness Visit with me. I enjoyed our conversation and look forward to speaking with you again next year. I, as well as your care team,  appreciate your ongoing commitment to your health goals. Please review the following plan we discussed and let me know if I can assist you in the future. Your Game plan/ To Do List    Follow up Visits: Next Medicare AWV with our clinical staff: 03/08/2025.   Have you seen your provider in the last 6 months (3 months if uncontrolled diabetes)? No Next Office Visit with your provider: 03/04/2024.  Clinician Recommendations:  Aim for 30 minutes of exercise or brisk walking, 6-8 glasses of water, and 5 servings of fruits and vegetables each day. Keep up the good work.      This is a list of the screening recommended for you and due dates:  Health Maintenance  Topic Date Due   COVID-19 Vaccine (4 - 2024-25 season) 03/12/2024*   Flu Shot  03/04/2024   DTaP/Tdap/Td vaccine (2 - Td or Tdap) 06/13/2024   Mammogram  09/02/2024   Medicare Annual Wellness Visit  03/02/2025   Colon Cancer Screening  11/02/2028   Pneumococcal Vaccine for age over 5  Completed   DEXA scan (bone density measurement)  Completed   Hepatitis C Screening  Completed   Zoster (Shingles) Vaccine  Completed   Hepatitis B Vaccine  Aged Out   HPV Vaccine  Aged Out   Meningitis B Vaccine  Aged Out  *Topic was postponed. The date shown is not the original due date.    Advanced directives: (Copy Requested) Please bring a copy of your health care power of attorney and living will to the office to be added to your chart at your convenience. You can mail to [facility] [address] [place], [place] [address] or email to [email or web address] Advance Care Planning is important because it:  [x]  Makes sure you receive the medical care that is consistent with your values, goals, and  preferences  [x]  It provides guidance to your family and loved ones and reduces their decisional burden about whether or not they are making the right decisions based on your wishes.  Follow the link provided in your after visit summary or read over the paperwork we have mailed to you to help you started getting your Advance Directives in place. If you need assistance in completing these, please reach out to us  so that we can help you!  See attachments for Preventive Care and Fall Prevention Tips.

## 2024-03-04 ENCOUNTER — Ambulatory Visit (INDEPENDENT_AMBULATORY_CARE_PROVIDER_SITE_OTHER): Admitting: Family Medicine

## 2024-03-04 ENCOUNTER — Encounter: Payer: Self-pay | Admitting: Family Medicine

## 2024-03-04 VITALS — BP 135/77 | HR 83 | Temp 98.0°F | Ht 65.5 in | Wt 144.0 lb

## 2024-03-04 DIAGNOSIS — Z0000 Encounter for general adult medical examination without abnormal findings: Secondary | ICD-10-CM | POA: Diagnosis not present

## 2024-03-04 DIAGNOSIS — H8110 Benign paroxysmal vertigo, unspecified ear: Secondary | ICD-10-CM

## 2024-03-04 NOTE — Patient Instructions (Signed)

## 2024-03-04 NOTE — Progress Notes (Signed)
 Office Note 03/04/2024  CC:  Chief Complaint  Patient presents with   Annual Exam    HPI:  Patient is a 73 y.o. female who is here for annual health maintenance exam and follow-up history of major depressive disorder.  Mood and anxiety levels have been good.  Chart is feeling well except for having some recent intermittent periods of vertigo.  Rolling over in bed triggers it as well as standing up quick triggers it.  She feels a brief sensation of movement in slight nausea.  It resolves pretty quickly it does not happen every day.  She thinks it started around a month ago. Feels normal in between episodes.  Past Medical History:  Diagnosis Date   Depression    GERD (gastroesophageal reflux disease)    History of adenomatous polyp of colon    History of hiatal hernia    History of vitamin D deficiency    pt denies   Hypercholesterolemia 2020/2021   statin recommended 2021   Osteoarthritis    Multiple joints; left THR, right TKA   Osteoporosis 09/2014 DEXA   Worsening osteoporosis R hip.  Pt declined trial of bisphosphonate from GYN. Rpt DEXA at GYN 2020 improved per pt report. 08/2023 DEXA T score -2.5.   Paraesophageal hiatal hernia    Surgically repaired 09/2014   Urinary tract infection    hx of     Past Surgical History:  Procedure Laterality Date   COLONOSCOPY  09/16/2018   repeat 5 years, tubular adenomas   COLONOSCOPY W/ POLYPECTOMY  Multiple.  Most recent 04/15/13.    2 adenomas; repeat 5 yrs (Dr. Donnald)   DEXA  12/16/2016   T-score -2.5 (improved)-Dr. McComb.  08/2023 unchanged.   EYE SURGERY Right 11/2016   cataract    HIATAL HERNIA REPAIR N/A 09/27/2014   Procedure: Laparoscopic takedown of large type III hiatus hernia and Nissen fundoplication over #56 dilator;  Surgeon: Donnice KATHEE Lunger, MD;  Location: [facility];  Service: General;  Laterality: N/A;   PARS PLANA VITRECTOMY W/ REPAIR OF MACULAR HOLE  07/21/2016   TOTAL HIP ARTHROPLASTY  2008   left (Dr.  Jane)   TOTAL KNEE ARTHROPLASTY  11/2013   Right.  [facility]: Dr. Dava   VAGINAL HYSTERECTOMY  1991   fibroids (both ovaries still in)    Family History  Problem Relation Age of Onset   Emphysema Mother    Stroke Father    Hypertension Father    Hyperlipidemia Father    Heart disease Sister        Afib   Retinal detachment Brother    Cancer Sister        bladder    Social History   Socioeconomic History   Marital status: Married    Spouse name: Lynwood   Number of children: 2   Years of education: Not on file   Highest education level: Not on file  Occupational History   Occupation: RETIRED  Tobacco Use   Smoking status: Never   Smokeless tobacco: Never  Vaping Use   Vaping status: Never Used  Substance and Sexual Activity   Alcohol use: No   Drug use: No   Sexual activity: Not on file  Other Topics Concern   Not on file  Social History Narrative   ** Merged History Encounter **  Married, 2 children.   Occupation: real estate agent   No T/A/Ds.      Lives with husband and has 1 dog/2025   Social  Drivers of Corporate investment banker Strain: Low Risk  (03/02/2024)   Overall Financial Resource Strain (CARDIA)    Difficulty of Paying Living Expenses: Not very hard  Food Insecurity: No Food Insecurity (03/02/2024)   Hunger Vital Sign    Worried About Running Out of Food in the Last Year: Never true    Ran Out of Food in the Last Year: Never true  Transportation Needs: No Transportation Needs (03/02/2024)   PRAPARE - Administrator, Civil Service (Medical): No    Lack of Transportation (Non-Medical): No  Physical Activity: Inactive (03/02/2024)   Exercise Vital Sign    Days of Exercise per Week: 0 days    Minutes of Exercise per Session: 0 min  Stress: No Stress Concern Present (03/02/2024)   [employer] of Occupational Health - Occupational Stress Questionnaire    Feeling of Stress: Not at all  Social Connections: Socially Integrated  (03/02/2024)   Social Connection and Isolation Panel    Frequency of Communication with Friends and Family: More than three times a week    Frequency of Social Gatherings with Friends and Family: Once a week    Attends Religious Services: More than 4 times per year    Active Member of [employer] or Organizations: Yes    Attends Banker Meetings: Never    Marital Status: Married  Catering manager Violence: Not At Risk (03/02/2024)   Humiliation, Afraid, Rape, and Kick questionnaire    Fear of Current or Ex-Partner: No    Emotionally Abused: No    Physically Abused: No    Sexually Abused: No    Outpatient Medications Prior to Visit  Medication Sig Dispense Refill   buPROPion  (WELLBUTRIN  XL) 300 MG 24 hr tablet Take 1 tablet (300 mg total) by mouth daily. 90 tablet 0   naproxen  (NAPROSYN ) 500 MG tablet TAKE 1 TABLET BY MOUTH TWICE A DAY WITH FOOD (Patient taking differently: Take 500 mg by mouth as needed.) 60 tablet 5   tretinoin (RETIN-A) 0.05 % cream Apply topically at bedtime.     No facility-administered medications prior to visit.    No Known Allergies  Review of Systems  Constitutional:  Negative for appetite change, chills, fatigue and fever.  HENT:  Negative for congestion, dental problem, ear pain and sore throat.   Eyes:  Negative for discharge, redness and visual disturbance.  Respiratory:  Negative for cough, chest tightness, shortness of breath and wheezing.   Cardiovascular:  Negative for chest pain, palpitations and leg swelling.  Gastrointestinal:  Negative for abdominal pain, blood in stool, diarrhea, nausea and vomiting.  Genitourinary:  Negative for difficulty urinating, dysuria, flank pain, frequency, hematuria and urgency.  Musculoskeletal:  Negative for arthralgias, back pain, joint swelling, myalgias and neck stiffness.  Skin:  Negative for pallor and rash.  Neurological:  Positive for dizziness. Negative for speech difficulty, weakness and headaches.   Hematological:  Negative for adenopathy. Does not bruise/bleed easily.  Psychiatric/Behavioral:  Negative for confusion and sleep disturbance. The patient is not nervous/anxious.     PE;    03/04/2024    8:30 AM 03/02/2024   10:44 AM 02/25/2023   11:26 AM  Vitals with BMI  Height 5' 5.5 5' 5.5   Weight 144 lbs 146 lbs 146 lbs  BMI 23.59 23.92   Systolic 135    Diastolic 77    Pulse 83    Exam chaperoned by Cloe Motsinger, CMA  Gen: Alert, well appearing.  Patient  is oriented to person, place, time, and situation. AFFECT: pleasant, lucid thought and speech. ENT: Ears: EACs clear, normal epithelium.  TMs with good light reflex and landmarks bilaterally.  Eyes: no injection, icteris, swelling, or exudate.  EOMI, PERRLA. Nose: no drainage or turbinate edema/swelling.  No injection or focal lesion.  Mouth: lips without lesion/swelling.  Oral mucosa pink and moist.  Dentition intact and without obvious caries or gingival swelling.  Oropharynx without erythema, exudate, or swelling.  Neck: supple/nontender.  No LAD, mass, or TM.  Carotid pulses 2+ bilaterally, without bruits. CV: RRR, no m/r/g.   LUNGS: CTA bilat, nonlabored resps, good aeration in all lung fields. ABD: soft, NT, ND, BS normal.  No hepatospenomegaly or mass.  No bruits. EXT: no clubbing, cyanosis, or edema.  Musculoskeletal: no joint swelling, erythema, warmth, or tenderness.  ROM of all joints intact. Skin - no sores or suspicious lesions or rashes or color changes  Pertinent labs:  Lab Results  Component Value Date   TSH 1.26 05/25/2019   Lab Results  Component Value Date   WBC 6.2 12/03/2022   HGB 12.7 12/03/2022   HCT 38.5 12/03/2022   MCV 83.8 12/03/2022   PLT 203.0 12/03/2022   Lab Results  Component Value Date   CREATININE 0.97 12/03/2022   BUN 18 12/03/2022   NA 141 12/03/2022   K 4.0 12/03/2022   CL 104 12/03/2022   CO2 30 12/03/2022   Lab Results  Component Value Date   ALT 13 12/03/2022    AST 17 12/03/2022   ALKPHOS 88 12/03/2022   BILITOT 0.7 12/03/2022   Lab Results  Component Value Date   CHOL 199 12/03/2022   Lab Results  Component Value Date   HDL 69.20 12/03/2022   Lab Results  Component Value Date   LDLCALC 104 (H) 12/03/2022   Lab Results  Component Value Date   TRIG 132.0 12/03/2022   Lab Results  Component Value Date   CHOLHDL 3 12/03/2022   ASSESSMENT AND PLAN:   #1 health maintenance exam: Reviewed age and gender appropriate health maintenance issues (prudent diet, regular exercise, health risks of tobacco and excessive alcohol, use of seatbelts, fire alarms in home, use of sunscreen).  Also reviewed age and gender appropriate health screening as well as vaccine recommendations. Vaccines: ALL UTD. Labs: CMET, CBC, FLP ordered. Cervical ca screening: hx of hysterectomy for benign dx, followed by GYN MD, no further paps. Breast ca screening: gets annual mammogram via her GYN office. Colon ca screening: Hx of adenomas->last colonoscopy was 11/03/23. Osteoporosis screening: had DEXA 2020 at GYN MD, per pt this showed stable bone density.  #2 BPPV. Home Epley maneuvers handout was given today.  #3 history of depression and anxiety. She has done well long-term on Wellbutrin  XL 300 mg a day.  Her GYN MD recently refilled this for her.  An After Visit Summary was printed and given to the patient.  FOLLOW UP:  No follow-ups on file.  Signed:  Gerlene Hockey, MD           03/04/2024

## 2024-03-05 LAB — COMPREHENSIVE METABOLIC PANEL WITH GFR
AG Ratio: 2 (calc) (ref 1.0–2.5)
ALT: 12 U/L (ref 6–29)
AST: 16 U/L (ref 10–35)
Albumin: 4.4 g/dL (ref 3.6–5.1)
Alkaline phosphatase (APISO): 79 U/L (ref 37–153)
BUN/Creatinine Ratio: 17 (calc) (ref 6–22)
BUN: 17 mg/dL (ref 7–25)
CO2: 29 mmol/L (ref 20–32)
Calcium: 9.2 mg/dL (ref 8.6–10.4)
Chloride: 105 mmol/L (ref 98–110)
Creat: 1.02 mg/dL — ABNORMAL HIGH (ref 0.60–1.00)
Globulin: 2.2 g/dL (ref 1.9–3.7)
Glucose, Bld: 95 mg/dL (ref 65–99)
Potassium: 4.1 mmol/L (ref 3.5–5.3)
Sodium: 142 mmol/L (ref 135–146)
Total Bilirubin: 0.6 mg/dL (ref 0.2–1.2)
Total Protein: 6.6 g/dL (ref 6.1–8.1)
eGFR: 57 mL/min/1.73m2 — ABNORMAL LOW (ref >=60)

## 2024-03-05 LAB — CBC WITH DIFFERENTIAL/PLATELET
Absolute Lymphocytes: 2105 {cells}/uL (ref 850–3900)
Absolute Monocytes: 464 {cells}/uL (ref 200–950)
Basophils Absolute: 49 {cells}/uL (ref 0–200)
Basophils Relative: 0.8 %
Eosinophils Absolute: 201 {cells}/uL (ref 15–500)
Eosinophils Relative: 3.3 %
HCT: 40.9 % (ref 35.0–45.0)
Hemoglobin: 13.1 g/dL (ref 11.7–15.5)
MCH: 27.4 pg (ref 27.0–33.0)
MCHC: 32 g/dL (ref 32.0–36.0)
MCV: 85.6 fL (ref 80.0–100.0)
MPV: 9.7 fL (ref 7.5–12.5)
Monocytes Relative: 7.6 %
Neutro Abs: 3282 {cells}/uL (ref 1500–7800)
Neutrophils Relative %: 53.8 %
Platelets: 209 Thousand/uL (ref 140–400)
RBC: 4.78 Million/uL (ref 3.80–5.10)
RDW: 13.5 % (ref 11.0–15.0)
Total Lymphocyte: 34.5 %
WBC: 6.1 Thousand/uL (ref 3.8–10.8)

## 2024-03-05 LAB — LIPID PANEL
Cholesterol: 218 mg/dL — ABNORMAL HIGH (ref <200)
HDL: 73 mg/dL (ref >=50)
LDL Cholesterol (Calc): 121 mg/dL — ABNORMAL HIGH
Non-HDL Cholesterol (Calc): 145 mg/dL — ABNORMAL HIGH (ref <130)
Total CHOL/HDL Ratio: 3 (calc) (ref <5.0)
Triglycerides: 126 mg/dL (ref <150)

## 2024-03-06 ENCOUNTER — Ambulatory Visit: Payer: Self-pay | Admitting: Family Medicine

## 2024-06-15 DIAGNOSIS — H35341 Macular cyst, hole, or pseudohole, right eye: Secondary | ICD-10-CM | POA: Diagnosis not present

## 2024-06-15 DIAGNOSIS — H52223 Regular astigmatism, bilateral: Secondary | ICD-10-CM | POA: Diagnosis not present

## 2024-06-15 DIAGNOSIS — H53413 Scotoma involving central area, bilateral: Secondary | ICD-10-CM | POA: Diagnosis not present

## 2024-06-15 DIAGNOSIS — D3132 Benign neoplasm of left choroid: Secondary | ICD-10-CM | POA: Diagnosis not present

## 2024-06-15 DIAGNOSIS — H5201 Hypermetropia, right eye: Secondary | ICD-10-CM | POA: Diagnosis not present

## 2024-06-15 DIAGNOSIS — H524 Presbyopia: Secondary | ICD-10-CM | POA: Diagnosis not present

## 2024-06-15 DIAGNOSIS — Z961 Presence of intraocular lens: Secondary | ICD-10-CM | POA: Diagnosis not present

## 2024-06-17 ENCOUNTER — Encounter: Payer: Self-pay | Admitting: Family Medicine

## 2024-06-20 DIAGNOSIS — Z6822 Body mass index (BMI) 22.0-22.9, adult: Secondary | ICD-10-CM | POA: Diagnosis not present

## 2024-06-20 DIAGNOSIS — R051 Acute cough: Secondary | ICD-10-CM | POA: Diagnosis not present

## 2024-06-20 DIAGNOSIS — R509 Fever, unspecified: Secondary | ICD-10-CM | POA: Diagnosis not present

## 2024-06-20 DIAGNOSIS — U071 COVID-19: Secondary | ICD-10-CM | POA: Diagnosis not present

## 2024-06-20 DIAGNOSIS — M791 Myalgia, unspecified site: Secondary | ICD-10-CM | POA: Diagnosis not present

## 2024-07-08 ENCOUNTER — Ambulatory Visit: Payer: Self-pay

## 2024-07-08 ENCOUNTER — Ambulatory Visit (HOSPITAL_BASED_OUTPATIENT_CLINIC_OR_DEPARTMENT_OTHER)
Admission: RE | Admit: 2024-07-08 | Discharge: 2024-07-08 | Disposition: A | Source: Ambulatory Visit | Attending: Sports Medicine

## 2024-07-08 ENCOUNTER — Ambulatory Visit (INDEPENDENT_AMBULATORY_CARE_PROVIDER_SITE_OTHER): Admitting: Sports Medicine

## 2024-07-08 ENCOUNTER — Encounter: Payer: Self-pay | Admitting: Sports Medicine

## 2024-07-08 VITALS — BP 130/83 | HR 69 | Temp 98.1°F | Wt 144.4 lb

## 2024-07-08 DIAGNOSIS — M47816 Spondylosis without myelopathy or radiculopathy, lumbar region: Secondary | ICD-10-CM | POA: Diagnosis not present

## 2024-07-08 DIAGNOSIS — M1611 Unilateral primary osteoarthritis, right hip: Secondary | ICD-10-CM | POA: Diagnosis not present

## 2024-07-08 DIAGNOSIS — Z96642 Presence of left artificial hip joint: Secondary | ICD-10-CM | POA: Diagnosis not present

## 2024-07-08 DIAGNOSIS — M25552 Pain in left hip: Secondary | ICD-10-CM

## 2024-07-08 MED ORDER — PREDNISONE 20 MG PO TABS: 40.0000 mg | ORAL_TABLET | Freq: Every day | ORAL | 0 refills | Status: AC

## 2024-07-08 NOTE — Progress Notes (Signed)
 Careteam: Patient Care Team: Candise Aleene DEL, MD as PCP - General (Family Medicine) Donnald Charleston, MD as Consulting Physician (Gastroenterology) Gladis Cough, MD as Consulting Physician (General Surgery) Dermatology, [facility], MD as Referring Physician (Orthopedic Surgery) Leva Rush, MD as Consulting Physician (Obstetrics and Gynecology) Debarah Lorrene DEL., MD (Ophthalmology)  No Known Allergies  Chief Complaint  Patient presents with   Hip Pain    Pt has been having left him pain and is not sure if she injured it.     Discussed the use of AI scribe software for clinical note transcription with the patient, who gave verbal consent to proceed.  History of Present Illness    Katelyn Cuevas is a 73 year old female with a history of hip replacement who presents with acute left hip pain.  She experienced a sudden onset of severe pain in her hip, which began yesterday. The pain is described as very strong and localized to the side of her hip, without radiation down her legs. It is exacerbated by standing and walking, but improves with rest and sitting. No recent trauma, falls, or popping sounds were noted. She was engaged in activities such as vacuuming and decorating prior to the onset of pain.  She has a history of hip replacement surgery over ten years ago, which has been functioning well until this recent episode. She has been very active in the past few weeks, including a lot of walking, without any prior hip pain.  For pain management, she took naproxen  last night, which helped her sleep, and used a heating pad on her hip. No fever, chills, tingling, numbness, or muscle aching. She has not noticed any bruising in the area.  Review of Systems:  Review of Systems  Constitutional:  Negative for chills and fever.  HENT:  Negative for congestion and sore throat.   Eyes:  Negative for double vision.  Respiratory:  Negative for cough, sputum  production and shortness of breath.   Cardiovascular:  Negative for chest pain, palpitations and leg swelling.  Gastrointestinal:  Negative for abdominal pain, heartburn and nausea.  Genitourinary:  Negative for dysuria, frequency and hematuria.  Musculoskeletal:  Positive for joint pain. Negative for falls and myalgias.  Neurological:  Negative for dizziness, sensory change and focal weakness.   Negative unless indicated in HPI.   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   Tubular adenoma of colon 05/02/2013   S/P hip replacement 09/29/2012   VITAMIN D DEFICIENCY 12/09/2007   DEPRESSIVE DISORDER NOT ELSEWHERE CLASSIFIED 12/09/2007   PAIN IN JOINT, SITE UNSPECIFIED 12/03/2007   UNSPECIFIED MYALGIA AND MYOSITIS 12/03/2007   EDEMA 12/03/2007   Past Medical History:  Diagnosis Date   Depression    Diverticulosis    GERD (gastroesophageal reflux disease)    History of adenomatous polyp of colon    History of hiatal hernia    History of vitamin D deficiency    pt denies   Hypercholesterolemia 2020/2021   statin recommended 2021   Osteoarthritis    Multiple joints; left THR, right TKA   Osteoporosis 09/2014 DEXA   Worsening osteoporosis R hip.  Pt declined trial of bisphosphonate from GYN. Rpt DEXA at GYN 2020 improved per pt report. 08/2023 DEXA T score -2.5.   Paraesophageal hiatal hernia  Surgically repaired 09/2014   Urinary tract infection    hx of    Past Surgical History:  Procedure Laterality Date   COLONOSCOPY  09/16/2018   repeat 5 years, tubular adenomas   COLONOSCOPY W/ POLYPECTOMY  Multiple.  Most recent 04/15/13.    11/2023 -->2 adenomas; repeat 5 yrs (Dr. Donnald)   DEXA  12/16/2016   T-score -2.5 (improved)-Dr. McComb.  08/2023 unchanged.   EYE SURGERY Right  11/2016   cataract    HIATAL HERNIA REPAIR N/A 09/27/2014   Procedure: Laparoscopic takedown of large type III hiatus hernia and Nissen fundoplication over #56 dilator;  Surgeon: Donnice KATHEE Lunger, MD;  Location: [facility];  Service: General;  Laterality: N/A;   PARS PLANA VITRECTOMY W/ REPAIR OF MACULAR HOLE  07/21/2016   TOTAL HIP ARTHROPLASTY  2008   left (Dr. Jane)   TOTAL KNEE ARTHROPLASTY  11/2013   Right.  [facility]: Dr. Dava   VAGINAL HYSTERECTOMY  1991   fibroids (both ovaries still in)   Social History   Tobacco Use   Smoking status: Never   Smokeless tobacco: Never  Vaping Use   Vaping status: Never Used  Substance Use Topics   Alcohol use: No   Drug use: No   Family History  Problem Relation Age of Onset   Emphysema Mother    Stroke Father    Hypertension Father    Hyperlipidemia Father    Heart disease Sister        Afib   Retinal detachment Brother    Cancer Sister        bladder   No Known Allergies  Medications: Patient's Medications  New Prescriptions   No medications on file  Previous Medications   BUPROPION  (WELLBUTRIN  XL) 300 MG 24 HR TABLET    Take 1 tablet (300 mg total) by mouth daily.   NAPROXEN  (NAPROSYN ) 500 MG TABLET    TAKE 1 TABLET BY MOUTH TWICE A DAY WITH FOOD   TRETINOIN (RETIN-A) 0.05 % CREAM    Apply topically at bedtime.  Modified Medications   No medications on file  Discontinued Medications   No medications on file    Physical Exam: Vitals:   07/08/24 1114 07/08/24 1116  BP: (!) 145/76 130/83  Pulse: 69   Temp: 98.1 F (36.7 C)   TempSrc: Oral   SpO2: 99%   Weight: 144 lb 6.4 oz (65.5 kg)    Body mass index is 23.66 kg/m. BP Readings from Last 3 Encounters:  07/08/24 130/83  03/04/24 135/77  12/03/22 129/78   Wt Readings from Last 3 Encounters:  07/08/24 144 lb 6.4 oz (65.5 kg)  03/04/24 144 lb (65.3 kg)  03/02/24 146 lb (66.2 kg)    Physical Exam Constitutional:      Appearance: Normal appearance.  HENT:      Head: Normocephalic and atraumatic.  Cardiovascular:     Rate and Rhythm: Normal rate and regular rhythm.  Pulmonary:     Effort: Pulmonary effort is normal. No respiratory distress.     Breath sounds: Normal breath sounds. No wheezing.  Abdominal:     General: Bowel sounds are normal. There is no distension.     Tenderness: There is no abdominal tenderness. There is no guarding or rebound.     Comments:    Musculoskeletal:        General: No swelling or tenderness.     Comments: Left hip lateral tenderness  Pain reproducible with flexion, ext , rotation  Neurological:     Mental Status: She is alert. Mental status is at baseline.     Sensory: No sensory deficit.     Motor: No weakness.     Labs reviewed: Basic Metabolic Panel: Recent Labs    03/04/24 0856  NA 142  K 4.1  CL 105  CO2 29  GLUCOSE 95  BUN 17  CREATININE 1.02*  CALCIUM 9.2   Liver Function Tests: Recent Labs    03/04/24 0856  AST 16  ALT 12  BILITOT 0.6  PROT 6.6   No results for input(s): LIPASE, AMYLASE in the last 8760 hours. No results for input(s): AMMONIA in the last 8760 hours. CBC: Recent Labs    03/04/24 0856  WBC 6.1  NEUTROABS 3,282  HGB 13.1  HCT 40.9  MCV 85.6  PLT 209   Lipid Panel: Recent Labs    03/04/24 0856  CHOL 218*  HDL 73  LDLCALC 121*  TRIG 126  CHOLHDL 3.0   TSH: No results for input(s): TSH in the last 8760 hours. A1C: No results found for: HGBA1C  Assessment & Plan Pain of left hip Pain in left hip since yesterday  No recent trauma Will send prednisone  to her pharmacy  Will get x ray left hip  Use heating pads and lidocaine  patches Orders:   DG HIP UNILAT W OR W/O PELVIS 2-3 VIEWS LEFT; Future   predniSONE  (DELTASONE ) 20 MG tablet; Take 2 tablets (40 mg total) by mouth daily for 5 days.

## 2024-07-08 NOTE — Patient Instructions (Signed)
 [facility] [address], [address] [place],  [place]  [address]  Main: [phone]

## 2024-07-08 NOTE — Telephone Encounter (Signed)
 FYI Only or Action Required?: FYI only for provider: appointment scheduled on 07/08/2024 at 11:40am with Dr Sherlynn at patient's PCP office.  Patient was last seen in primary care on 03/04/2024 by McGowen, Aleene DEL, MD.  Called Nurse Triage reporting Hip Pain.  Symptoms began yesterday.  Interventions attempted: OTC medications: Naproxen  and Rest, hydration, or home remedies.  Symptoms are: unchanged.  Triage Disposition: See HCP Within 4 Hours (Or PCP Triage)  Patient/caregiver understands and will follow disposition?: Yes               Copied from CRM #[id number]. Topic: Clinical - Red Word Triage >> Jul 08, 2024  7:55 AM Carlyon D wrote: Red Word that prompted transfer to Nurse Triage:Extreme L hip pain by the ball joint of the hip. Reason for Disposition  [1] SEVERE pain (e.g., excruciating, unable to do any normal activities) AND [2] not improved after 2 hours of pain medicine  Answer Assessment - Initial Assessment Questions Left hip replacement ten years or more ago Yesterday, patient was decorating around the house and started having sharp pain in the hip Denies any injuries Sitting still--no pain but walking makes 10 out of 10 pain Katelyn Cuevas is the surgeon who did her hip surgery on [employer] states he isnt there anymore She states Naproxen  didn't help at all Patient is advised to call us  back if anything changes or with any further questions/concerns. Patient is advised that if anything worsens to go to the Emergency Room. Patient verbalized understanding.    1. LOCATION and RADIATION: Where is the pain located? Does the pain spread (shoot) anywhere else?     Left hip--localized to the hip per patient 2. QUALITY: What does the pain feel like?  (e.g., sharp, dull, aching, burning)     sharp 3. SEVERITY: How bad is the pain? What does it keep you from doing?   (Scale 1-10; or mild, moderate, severe)     0 when at rest but 10 when walking  per patient 4. ONSET: When did the pain start? Does it come and go, or is it there all the time?     Yesterday while decorating around the house 5. WORK OR EXERCISE: Has there been any recent work or exercise that involved this part of the body?      Just daily walking/activity 6. CAUSE: What do you think is causing the hip pain?      unknown 7. AGGRAVATING FACTORS: What makes the hip pain worse? (e.g., walking, climbing stairs, running)     walking 8. OTHER SYMPTOMS: Do you have any other symptoms? (e.g., back pain, pain shooting down leg,  fever, rash)     denies  Protocols used: Hip Pain-A-AH

## 2024-07-12 ENCOUNTER — Ambulatory Visit: Payer: Self-pay | Admitting: Sports Medicine

## 2024-07-12 ENCOUNTER — Telehealth: Payer: Self-pay

## 2024-07-12 NOTE — Telephone Encounter (Signed)
Pt is following up on xray results. Please advise

## 2024-07-12 NOTE — Telephone Encounter (Signed)
 Pt advised.

## 2024-07-13 NOTE — Telephone Encounter (Signed)
 Pt advised.

## 2024-07-27 ENCOUNTER — Encounter: Payer: Self-pay | Admitting: Family Medicine

## 2024-07-29 ENCOUNTER — Encounter: Payer: Self-pay | Admitting: Medical-Surgical

## 2024-07-29 ENCOUNTER — Ambulatory Visit (INDEPENDENT_AMBULATORY_CARE_PROVIDER_SITE_OTHER): Admitting: Medical-Surgical

## 2024-07-29 VITALS — BP 129/81 | HR 67 | Resp 20 | Ht 65.5 in | Wt 142.0 lb

## 2024-07-29 DIAGNOSIS — H6121 Impacted cerumen, right ear: Secondary | ICD-10-CM

## 2024-07-29 DIAGNOSIS — J101 Influenza due to other identified influenza virus with other respiratory manifestations: Secondary | ICD-10-CM | POA: Diagnosis not present

## 2024-07-29 LAB — POC COVID19/FLU A&B COMBO
Covid Antigen, POC: NEGATIVE
Influenza A Antigen, POC: POSITIVE — AB
Influenza B Antigen, POC: NEGATIVE

## 2024-07-29 MED ORDER — OSELTAMIVIR PHOSPHATE 75 MG PO CAPS: 75.0000 mg | ORAL_CAPSULE | Freq: Two times a day (BID) | ORAL | 0 refills | Status: AC

## 2024-07-29 NOTE — Addendum Note (Signed)
 Addended by: FANNY NIELS CROME on: 07/29/2024 12:05 PM   Modules accepted: Orders

## 2024-07-29 NOTE — Progress Notes (Signed)
 "       Established patient visit   History of Present Illness   Discussed the use of AI scribe software for clinical note transcription with the patient, who gave verbal consent to proceed.  History of Present Illness   Katelyn Cuevas is a 74 year old female who presents with a persistent cough and fatigue following recent travel.  - Onset a day or two before Christmas, following a cruise and trip to [place] - Congested sensation but nonproductive - Nocturnal symptoms absent; sleeps well without worsening at night - Temporary relief with cough drops - No fever, chills, sinus congestion, sore throat, or significant ear pressure - Mild right earache present - Persistent fatigue since recent travel - No associated sleep disturbance - One episode of diarrhea this morning - No nausea or vomiting  Physical Exam   Physical Exam Vitals reviewed.  Constitutional:      General: She is not in acute distress.    Appearance: Normal appearance. She is not ill-appearing.  HENT:     Head: Normocephalic and atraumatic.     Right Ear: There is impacted cerumen.     Left Ear: Tympanic membrane, ear canal and external ear normal. There is no impacted cerumen.     Nose: Nose normal.     Mouth/Throat:     Mouth: Mucous membranes are moist.     Pharynx: No oropharyngeal exudate or posterior oropharyngeal erythema.  Eyes:     General: No scleral icterus.       Right eye: No discharge.        Left eye: No discharge.     Extraocular Movements: Extraocular movements intact.     Conjunctiva/sclera: Conjunctivae normal.     Pupils: Pupils are equal, round, and reactive to light.  Cardiovascular:     Rate and Rhythm: Normal rate and regular rhythm.     Pulses: Normal pulses.     Heart sounds: Normal heart sounds. No murmur heard.    No friction rub. No gallop.  Pulmonary:     Effort: Pulmonary effort is normal. No respiratory distress.     Breath sounds: Normal breath sounds. No  wheezing, rhonchi or rales.  Musculoskeletal:     Cervical back: Neck supple.  Lymphadenopathy:     Cervical: No cervical adenopathy.  Skin:    General: Skin is warm and dry.  Neurological:     Mental Status: She is alert and oriented to person, place, and time.  Psychiatric:        Mood and Affect: Mood normal.        Behavior: Behavior normal.        Thought Content: Thought content normal.        Judgment: Judgment normal.    Assessment & Plan   Assessment and Plan    Influenza A - Swabbed for flu and COVID- negative COVID and flu B, positive for flu A. - Start Tamiflu  75mg  BID x 5 days. - Reviewed symptom management with OTC options.  - Recommend testing her husband as he has similar symptoms.   Impacted cerumen, right ear Indication: Cerumen impaction of the ear(s) Medical necessity statement: On physical examination, cerumen impairs clinically significant portions of the external auditory canal, and tympanic membrane. Noted obstructive, copious cerumen that cannot be removed without magnification and instrumentations Consent: Discussed benefits and risks of procedure and verbal consent obtained Procedure: Patient was prepped for the procedure. Utilized an otoscope to assess and take note  of the ear canal, the tympanic membrane, and the presence, amount, and placement of the cerumen. Gentle water irrigation and soft plastic curette was utilized to remove cerumen.  Post procedure examination: shows cerumen was completely removed. Patient tolerated procedure well. The patient is made aware that they may experience temporary vertigo, temporary hearing loss, and temporary discomfort. If these symptom last for more than 24 hours to call the clinic or proceed to the ED.  Follow up   Return if symptoms worsen or fail to improve. __________________________________ Zada FREDRIK Palin, DNP, APRN, FNP-BC Primary Care and Sports Medicine [facility] "

## 2024-08-10 ENCOUNTER — Encounter: Payer: Self-pay | Admitting: Family Medicine

## 2025-03-08 ENCOUNTER — Ambulatory Visit

## 2025-03-09 ENCOUNTER — Encounter: Admitting: Family Medicine
